# Patient Record
Sex: Female | Born: 1950 | Race: White | Hispanic: No | Marital: Married | Smoking: Current some day smoker
Health system: Southern US, Community
[De-identification: ages and names within clinical notes are randomized; demographics above are authoritative.]

## PROBLEM LIST (undated history)

## (undated) DIAGNOSIS — Z5189 Encounter for other specified aftercare: Secondary | ICD-10-CM

## (undated) DIAGNOSIS — N390 Urinary tract infection, site not specified: Secondary | ICD-10-CM

## (undated) DIAGNOSIS — F419 Anxiety disorder, unspecified: Secondary | ICD-10-CM

## (undated) DIAGNOSIS — T7840XA Allergy, unspecified, initial encounter: Secondary | ICD-10-CM

## (undated) DIAGNOSIS — Z905 Acquired absence of kidney: Secondary | ICD-10-CM

## (undated) DIAGNOSIS — IMO0002 Reserved for concepts with insufficient information to code with codable children: Secondary | ICD-10-CM

## (undated) DIAGNOSIS — M8580 Other specified disorders of bone density and structure, unspecified site: Secondary | ICD-10-CM

## (undated) DIAGNOSIS — I10 Essential (primary) hypertension: Secondary | ICD-10-CM

## (undated) HISTORY — DX: Anxiety disorder, unspecified: F41.9

## (undated) HISTORY — DX: Essential (primary) hypertension: I10

## (undated) HISTORY — PX: APPENDECTOMY: SHX54

## (undated) HISTORY — DX: Other specified disorders of bone density and structure, unspecified site: M85.80

## (undated) HISTORY — PX: NEPHRECTOMY: SHX65

## (undated) HISTORY — DX: Reserved for concepts with insufficient information to code with codable children: IMO0002

## (undated) HISTORY — DX: Urinary tract infection, site not specified: N39.0

## (undated) HISTORY — PX: TUBAL LIGATION: SHX77

## (undated) HISTORY — DX: Encounter for other specified aftercare: Z51.89

## (undated) HISTORY — DX: Allergy, unspecified, initial encounter: T78.40XA

## (undated) HISTORY — PX: POLYPECTOMY: SHX149

## (undated) HISTORY — DX: Acquired absence of kidney: Z90.5

---

## 1999-01-05 ENCOUNTER — Encounter: Admission: RE | Admit: 1999-01-05 | Discharge: 1999-01-05 | Payer: Self-pay | Admitting: Obstetrics and Gynecology

## 1999-01-05 ENCOUNTER — Encounter: Payer: Self-pay | Admitting: Obstetrics and Gynecology

## 2000-01-06 ENCOUNTER — Encounter: Admission: RE | Admit: 2000-01-06 | Discharge: 2000-01-06 | Payer: Self-pay | Admitting: Obstetrics and Gynecology

## 2000-01-06 ENCOUNTER — Encounter: Payer: Self-pay | Admitting: Obstetrics and Gynecology

## 2000-04-03 ENCOUNTER — Other Ambulatory Visit: Admission: RE | Admit: 2000-04-03 | Discharge: 2000-04-03 | Payer: Self-pay | Admitting: Obstetrics and Gynecology

## 2000-04-03 ENCOUNTER — Encounter (INDEPENDENT_AMBULATORY_CARE_PROVIDER_SITE_OTHER): Payer: Self-pay | Admitting: Specialist

## 2001-01-08 ENCOUNTER — Encounter: Payer: Self-pay | Admitting: Obstetrics and Gynecology

## 2001-01-08 ENCOUNTER — Encounter: Admission: RE | Admit: 2001-01-08 | Discharge: 2001-01-08 | Payer: Self-pay | Admitting: Obstetrics and Gynecology

## 2002-02-13 ENCOUNTER — Encounter: Admission: RE | Admit: 2002-02-13 | Discharge: 2002-02-13 | Payer: Self-pay | Admitting: Obstetrics and Gynecology

## 2002-02-13 ENCOUNTER — Encounter: Payer: Self-pay | Admitting: Obstetrics and Gynecology

## 2002-11-06 ENCOUNTER — Encounter: Admission: RE | Admit: 2002-11-06 | Discharge: 2002-11-06 | Payer: Self-pay

## 2002-11-25 ENCOUNTER — Ambulatory Visit (HOSPITAL_COMMUNITY): Admission: RE | Admit: 2002-11-25 | Discharge: 2002-11-25 | Payer: Self-pay

## 2003-04-22 ENCOUNTER — Encounter: Admission: RE | Admit: 2003-04-22 | Discharge: 2003-04-22 | Payer: Self-pay | Admitting: Obstetrics and Gynecology

## 2004-01-07 ENCOUNTER — Ambulatory Visit: Payer: Self-pay | Admitting: Internal Medicine

## 2004-01-21 ENCOUNTER — Ambulatory Visit: Payer: Self-pay | Admitting: Internal Medicine

## 2004-01-21 HISTORY — PX: COLONOSCOPY: SHX174

## 2004-08-11 ENCOUNTER — Encounter: Admission: RE | Admit: 2004-08-11 | Discharge: 2004-08-11 | Payer: Self-pay | Admitting: Obstetrics and Gynecology

## 2005-09-28 ENCOUNTER — Encounter: Admission: RE | Admit: 2005-09-28 | Discharge: 2005-09-28 | Payer: Self-pay | Admitting: Obstetrics and Gynecology

## 2007-01-09 ENCOUNTER — Encounter: Admission: RE | Admit: 2007-01-09 | Discharge: 2007-01-09 | Payer: Self-pay | Admitting: Obstetrics and Gynecology

## 2008-03-10 ENCOUNTER — Encounter: Admission: RE | Admit: 2008-03-10 | Discharge: 2008-03-10 | Payer: Self-pay | Admitting: Internal Medicine

## 2008-10-26 ENCOUNTER — Encounter (INDEPENDENT_AMBULATORY_CARE_PROVIDER_SITE_OTHER): Payer: Self-pay | Admitting: General Surgery

## 2008-10-26 ENCOUNTER — Inpatient Hospital Stay (HOSPITAL_COMMUNITY): Admission: EM | Admit: 2008-10-26 | Discharge: 2008-10-27 | Payer: Self-pay | Admitting: Emergency Medicine

## 2010-03-24 ENCOUNTER — Other Ambulatory Visit: Payer: Self-pay | Admitting: Internal Medicine

## 2010-03-24 DIAGNOSIS — Z1231 Encounter for screening mammogram for malignant neoplasm of breast: Secondary | ICD-10-CM

## 2010-04-07 ENCOUNTER — Ambulatory Visit
Admission: RE | Admit: 2010-04-07 | Discharge: 2010-04-07 | Disposition: A | Discharge status: Home / Self Care | Payer: 59 | Source: Ambulatory Visit | Attending: Internal Medicine | Admitting: Internal Medicine

## 2010-04-07 DIAGNOSIS — Z1231 Encounter for screening mammogram for malignant neoplasm of breast: Secondary | ICD-10-CM

## 2010-05-13 LAB — URINALYSIS, ROUTINE W REFLEX MICROSCOPIC
Bilirubin Urine: NEGATIVE
Glucose, UA: NEGATIVE mg/dL
Ketones, ur: 40 mg/dL — AB
Nitrite: NEGATIVE
Protein, ur: NEGATIVE mg/dL
Specific Gravity, Urine: 1.016 (ref 1.005–1.030)
Urobilinogen, UA: 0.2 mg/dL (ref 0.0–1.0)
pH: 7.5 (ref 5.0–8.0)

## 2010-05-13 LAB — CBC
HCT: 39.6 % (ref 36.0–46.0)
Hemoglobin: 13.5 g/dL (ref 12.0–15.0)
MCHC: 34.2 g/dL (ref 30.0–36.0)
MCV: 93.1 fL (ref 78.0–100.0)
Platelets: 220 10*3/uL (ref 150–400)
RBC: 4.26 MIL/uL (ref 3.87–5.11)
RDW: 12.9 % (ref 11.5–15.5)
WBC: 10.9 10*3/uL — ABNORMAL HIGH (ref 4.0–10.5)

## 2010-05-13 LAB — COMPREHENSIVE METABOLIC PANEL
ALT: 20 U/L (ref 0–35)
AST: 33 U/L (ref 0–37)
Albumin: 4.7 g/dL (ref 3.5–5.2)
Alkaline Phosphatase: 45 U/L (ref 39–117)
BUN: 15 mg/dL (ref 6–23)
CO2: 27 mEq/L (ref 19–32)
Calcium: 10.5 mg/dL (ref 8.4–10.5)
Chloride: 101 mEq/L (ref 96–112)
Creatinine, Ser: 1.1 mg/dL (ref 0.4–1.2)
GFR calc Af Amer: >60 mL/min (ref >60)
GFR calc non Af Amer: 51 mL/min — ABNORMAL LOW (ref >60)
Glucose, Bld: 101 mg/dL — ABNORMAL HIGH (ref 70–99)
Potassium: 3.6 mEq/L (ref 3.5–5.1)
Sodium: 139 mEq/L (ref 135–145)
Total Bilirubin: 2 mg/dL — ABNORMAL HIGH (ref 0.3–1.2)
Total Protein: 7.4 g/dL (ref 6.0–8.3)

## 2010-05-13 LAB — LIPASE, BLOOD: Lipase: 34 U/L (ref 11–59)

## 2010-05-13 LAB — DIFFERENTIAL
Basophils Absolute: 0 10*3/uL (ref 0.0–0.1)
Basophils Relative: 0 % (ref 0–1)
Eosinophils Absolute: 0.1 10*3/uL (ref 0.0–0.7)
Eosinophils Relative: 1 % (ref 0–5)
Lymphocytes Relative: 9 % — ABNORMAL LOW (ref 12–46)
Lymphs Abs: 1 10*3/uL (ref 0.7–4.0)
Monocytes Absolute: 0.6 10*3/uL (ref 0.1–1.0)
Monocytes Relative: 6 % (ref 3–12)
Neutro Abs: 9.2 10*3/uL — ABNORMAL HIGH (ref 1.7–7.7)
Neutrophils Relative %: 85 % — ABNORMAL HIGH (ref 43–77)

## 2010-05-13 LAB — URINE MICROSCOPIC-ADD ON

## 2011-04-27 ENCOUNTER — Other Ambulatory Visit: Payer: Self-pay | Admitting: Internal Medicine

## 2011-04-27 DIAGNOSIS — Z1231 Encounter for screening mammogram for malignant neoplasm of breast: Secondary | ICD-10-CM

## 2011-05-04 ENCOUNTER — Ambulatory Visit
Admission: RE | Admit: 2011-05-04 | Discharge: 2011-05-04 | Disposition: A | Discharge status: Home / Self Care | Payer: BC Managed Care – PPO | Source: Ambulatory Visit | Attending: Internal Medicine | Admitting: Internal Medicine

## 2011-05-04 DIAGNOSIS — Z1231 Encounter for screening mammogram for malignant neoplasm of breast: Secondary | ICD-10-CM

## 2012-06-20 ENCOUNTER — Other Ambulatory Visit: Payer: Self-pay

## 2012-06-20 DIAGNOSIS — Z1231 Encounter for screening mammogram for malignant neoplasm of breast: Secondary | ICD-10-CM

## 2012-08-21 ENCOUNTER — Ambulatory Visit
Admission: RE | Admit: 2012-08-21 | Discharge: 2012-08-21 | Disposition: A | Discharge status: Home / Self Care | Payer: BC Managed Care – PPO | Source: Ambulatory Visit

## 2012-08-21 DIAGNOSIS — Z1231 Encounter for screening mammogram for malignant neoplasm of breast: Secondary | ICD-10-CM

## 2013-03-18 ENCOUNTER — Encounter: Payer: Self-pay | Admitting: Obstetrics and Gynecology

## 2013-03-21 ENCOUNTER — Ambulatory Visit: Payer: Self-pay | Admitting: Obstetrics and Gynecology

## 2013-04-14 ENCOUNTER — Ambulatory Visit (INDEPENDENT_AMBULATORY_CARE_PROVIDER_SITE_OTHER): Payer: No Typology Code available for payment source | Admitting: Obstetrics and Gynecology

## 2013-04-14 ENCOUNTER — Encounter: Payer: Self-pay | Admitting: Obstetrics and Gynecology

## 2013-04-14 VITALS — BP 118/80 | HR 56 | Resp 16 | Ht 63.25 in | Wt 106.8 lb

## 2013-04-14 DIAGNOSIS — Z0000 Encounter for general adult medical examination without abnormal findings: Secondary | ICD-10-CM

## 2013-04-14 DIAGNOSIS — Z01419 Encounter for gynecological examination (general) (routine) without abnormal findings: Secondary | ICD-10-CM

## 2013-04-14 LAB — POCT URINALYSIS DIPSTICK
Bilirubin, UA: NEGATIVE
Glucose, UA: NEGATIVE
Ketones, UA: NEGATIVE
Leukocytes, UA: NEGATIVE
Nitrite, UA: NEGATIVE
Protein, UA: NEGATIVE
Urobilinogen, UA: NEGATIVE
pH, UA: 5

## 2013-04-14 MED ORDER — ESTRADIOL 2 MG VA RING: 2.0000 mg | VAGINAL_RING | VAGINAL | Status: DC

## 2013-04-14 NOTE — Progress Notes (Signed)
Patient ID: Natasha Stevens, female   DOB: Mar 18, 1950, 63 y.o.   MRN: [id number] GYNECOLOGY VISIT  PCP:   Lang Snow, MD  Referring provider:   HPI: 63 y.o.   Married  Caucasian  female   G3P3 with Patient's last menstrual period was 02/06/2002.   here for   AEX. Has been using the Estring.  Forgets to change it.  Wants to refill.  Has a new job.  Hgb:    PCP Urine:  1+ RBC's (per pt. This is common for her--neg. W/u with urologist)  GYNECOLOGIC HISTORY: Patient's last menstrual period was 02/06/2002. Sexually active:  yes Partner preference: female Contraception:  Tubal  Menopausal hormone therapy: n/a DES exposure:   no Blood transfusions:   Yes at age 35 when had nephrectomy Sexually transmitted diseases:no    GYN procedures and prior surgeries: Tubal ligation  Last mammogram:  July 2014 wnl, extremely dense:[facility]               Last pap and high risk HPV testing:  03-20-12 wnl:neg HR HPV  History of abnormal pap smear:  no   OB History   Grav Para Term Preterm Abortions TAB SAB Ect Mult Living   3 3        3        LIFESTYLE: Exercise:   aerobics            Tobacco:   no Alcohol:      14-18 glasses of wine per week Drug use:   o  OTHER HEALTH MAINTENANCE: Tetanus/TDap:   Up to date with PCP Gardisil:              n/a Influenza:            2014 Zostavax:            no  Bone density:    2013:osteopenia. Done with PCP Colonoscopy:    10 years ago with Dr. Sydell Axon Brodie:wnl  Cholesterol check: 2014 wnl with PCP  Family History  Problem Relation Age of Onset  . Hypertension Mother   . Osteoporosis Mother   . Anxiety disorder Sister   . Diabetes Maternal Grandfather   . Stroke Maternal Grandfather   . Hyperlipidemia Father     There are no active problems to display for this patient.  Past Medical History  Diagnosis Date  . 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 Osteopenia   . Frequent UTI     Hematuria  . Anxiety   . Blood transfusion  without reported diagnosis     w/nephrectomy at age 89  . Dyspareunia     Past Surgical History  Procedure Laterality Date  . Tubal ligation    . Nephrectomy      --age 33  . Appendectomy      ALLERGIES: Macrodantin  Current Outpatient Prescriptions  Medication Sig Dispense Refill  . Calcium Carb-Cholecalciferol (CALCIUM 600 + D PO) Take by mouth daily.      . Cholecalciferol (VITAMIN D PO) Take by mouth daily.      Marland Kitchen escitalopram (LEXAPRO) 20 MG tablet Take 20 mg by mouth daily.      . fluticasone (FLONASE) 50 MCG/ACT nasal spray Place 1 spray into both nostrils as needed.      . Zolpidem Tartrate (AMBIEN PO) Take 5 mg by mouth as needed.        No current facility-administered medications for this visit.  ROS:  Pertinent items are noted in HPI.  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   PHYSICAL EXAMINATION:    BP 118/80  Pulse 56  Resp 16  Ht 5' 3.25" (1.607 m)  Wt 106 lb 12.8 oz (48.444 kg)  BMI 18.76 kg/m2  LMP 02/06/2002   Wt Readings from Last 3 Encounters:  04/14/13 106 lb 12.8 oz (48.444 kg)     Ht Readings from Last 3 Encounters:  04/14/13 5' 3.25" (1.607 m)    General appearance: alert, cooperative and appears stated age Head: Normocephalic, without obvious abnormality, atraumatic Neck: no adenopathy, supple, symmetrical, trachea midline and thyroid not enlarged, symmetric, no tenderness/mass/nodules Lungs: clear to auscultation bilaterally Breasts: Inspection negative, No nipple retraction or dimpling, No nipple discharge or bleeding, No axillary or supraclavicular adenopathy, Normal to palpation without dominant masses Heart: regular rate and rhythm Abdomen: soft, non-tender; no masses,  no organomegaly Extremities: extremities normal, atraumatic, no cyanosis or edema Skin: Skin color, texture, turgor normal. No rashes or lesions Lymph nodes: Cervical, supraclavicular, and axillary nodes normal. No abnormal inguinal nodes  palpated Neurologic: Grossly normal  Pelvic: External genitalia:  no lesions              Urethra:  normal appearing urethra with no masses, tenderness or lesions              Bartholins and Skenes: normal                 Vagina: normal appearing vagina with normal color and discharge, no lesions.  Estring in place.               Cervix: normal appearance              Pap and high risk HPV testing done: no.            Bimanual Exam:  Uterus:  uterus is normal size, shape, consistency and nontender                                      Adnexa: normal adnexa in size, nontender and no masses                                      Rectovaginal: Confirms                                      Anus:  normal sphincter tone, no lesions  ASSESSMENT  Normal gynecologic exam. Osteopenia. Estring patient.   PLAN  Mammogram recommended yearly. I recommend 3D. Pap smear and high risk HPV testing not indicated.  Counseled on self breast exam, Calcium and vitamin D intake, exercise. Estring refilled for one year.  Reviewed benefits and risks - MI, DVT, PE, stroke, breast cancer. Zostivax recommended.  Bone density through PCP. Colonoscopy due.  Patient will call her GI. Return annually or prn   An After Visit Summary was printed and given to the patient.

## 2013-04-14 NOTE — Patient Instructions (Signed)

## 2013-08-11 ENCOUNTER — Other Ambulatory Visit: Payer: Self-pay

## 2013-08-11 DIAGNOSIS — Z1231 Encounter for screening mammogram for malignant neoplasm of breast: Secondary | ICD-10-CM

## 2013-09-09 ENCOUNTER — Encounter (INDEPENDENT_AMBULATORY_CARE_PROVIDER_SITE_OTHER): Payer: Self-pay

## 2013-09-09 ENCOUNTER — Ambulatory Visit
Admission: RE | Admit: 2013-09-09 | Discharge: 2013-09-09 | Disposition: A | Discharge status: Home / Self Care | Payer: No Typology Code available for payment source | Source: Ambulatory Visit

## 2013-09-09 DIAGNOSIS — Z1231 Encounter for screening mammogram for malignant neoplasm of breast: Secondary | ICD-10-CM

## 2013-10-15 ENCOUNTER — Encounter: Payer: Self-pay | Admitting: Internal Medicine

## 2013-11-19 ENCOUNTER — Telehealth: Payer: Self-pay | Admitting: Obstetrics and Gynecology

## 2013-11-19 NOTE — Telephone Encounter (Signed)
LMTCB about canceled appointment with Dr. Quincy Simmonds

## 2013-11-20 NOTE — Telephone Encounter (Signed)
Pt called back and schedule.

## 2013-12-04 ENCOUNTER — Encounter: Payer: Self-pay | Admitting: Internal Medicine

## 2013-12-08 ENCOUNTER — Encounter: Payer: Self-pay | Admitting: Obstetrics and Gynecology

## 2014-02-11 ENCOUNTER — Encounter: Payer: No Typology Code available for payment source | Admitting: Internal Medicine

## 2014-04-16 ENCOUNTER — Ambulatory Visit (INDEPENDENT_AMBULATORY_CARE_PROVIDER_SITE_OTHER): Payer: BLUE CROSS/BLUE SHIELD | Admitting: Obstetrics and Gynecology

## 2014-04-16 ENCOUNTER — Encounter: Payer: Self-pay | Admitting: Obstetrics and Gynecology

## 2014-04-16 VITALS — BP 120/78 | HR 56 | Resp 14 | Ht 63.0 in | Wt 105.8 lb

## 2014-04-16 DIAGNOSIS — Z0000 Encounter for general adult medical examination without abnormal findings: Secondary | ICD-10-CM

## 2014-04-16 DIAGNOSIS — Z01419 Encounter for gynecological examination (general) (routine) without abnormal findings: Secondary | ICD-10-CM

## 2014-04-16 LAB — POCT URINALYSIS DIPSTICK
Bilirubin, UA: NEGATIVE
Glucose, UA: NEGATIVE
Ketones, UA: NEGATIVE
Leukocytes, UA: NEGATIVE
Nitrite, UA: NEGATIVE
Protein, UA: NEGATIVE
Urobilinogen, UA: NEGATIVE
pH, UA: 5

## 2014-04-16 NOTE — Progress Notes (Signed)
Patient ID: Natasha Stevens, female   DOB: 1950/06/28, 64 y.o.   MRN: [id number] 63 y.o. G3P3 MarriedCaucasianF here for annual exam.    Mole on her bottom.   Off Estring.  Was forgetting to change it. Feels she does not need this.   Osteopenia on bone density in 2015.   Stable.  Was on Fosamax and doing a drug holiday.  Now off for 2 years.  PCP:  Lang Snow, MD  Patient's last menstrual period was 02/06/2002.          Sexually active: Yes.  female partner  The current method of family planning is tubal ligation.    Exercising: Yes.    aerobics, weights and jogging. Smoker:  no  Health Maintenance: Pap:  03-20-12 wnl:neg HR HPV History of abnormal Pap:  no MMG:  09-09-13 extremely dense/nl:[facility] Colonoscopy:  Due 01/2015 with Dr. Delfin Edis. BMD:   2013 Osteopenia with PCP TDaP:  Up to date with PCP Screening Labs:  Hb today: PCP, Urine today: 1+RBC's--has had neg. Urological work-up.  Sees Urology periodically, every 5 - 6 years.    reports that she has never smoked. She does not have any smokeless tobacco history on file. She reports that she drinks about 8.4 oz of alcohol per week. She reports that she does not use illicit drugs.  Past Medical History  Diagnosis Date  . 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 Osteopenia   . Frequent UTI     Hematuria  . Anxiety   . Blood transfusion without reported diagnosis     w/nephrectomy at age 61  . Dyspareunia     Past Surgical History  Procedure Laterality Date  . Tubal ligation    . Nephrectomy      --age 89  . Appendectomy      Current Outpatient Prescriptions  Medication Sig Dispense Refill  . Calcium Carb-Cholecalciferol (CALCIUM 600 + D PO) Take by mouth daily.    . Cholecalciferol (VITAMIN D PO) Take by mouth daily.    Marland Kitchen escitalopram (LEXAPRO) 20 MG tablet Take 20 mg by mouth daily.    . fluticasone (FLONASE) 50 MCG/ACT nasal spray Place 1 spray into both nostrils as needed.    . Zolpidem Tartrate  (AMBIEN PO) Take 5 mg by mouth as needed.      No current facility-administered medications for this visit.    Family History  Problem Relation Age of Onset  . Hypertension Mother   . Osteoporosis Mother   . Anxiety disorder Sister   . Diabetes Maternal Grandfather   . Stroke Maternal Grandfather   . Hyperlipidemia Father     ROS:  Pertinent items are noted in HPI.  Otherwise, a comprehensive ROS was negative.  Exam:   BP 120/78 mmHg  Pulse 56  Resp 14  Ht 5\' 3"  (1.6 m)  Wt 105 lb 12.8 oz (47.991 kg)  BMI 18.75 kg/m2  LMP 02/06/2002     Height: 5\' 3"  (160 cm)  Ht Readings from Last 3 Encounters:  04/16/14 5\' 3"  (1.6 m)  04/14/13 5' 3.25" (1.607 m)    General appearance: alert, cooperative and appears stated age Head: Normocephalic, without obvious abnormality, atraumatic Neck: no adenopathy, supple, symmetrical, trachea midline and thyroid normal to inspection and palpation Lungs: clear to auscultation bilaterally Breasts: normal appearance, no masses or tenderness, Inspection negative, No nipple retraction or dimpling, No nipple discharge or bleeding, No axillary or supraclavicular adenopathy  Heart: regular rate and rhythm Abdomen: soft, non-tender; bowel sounds normal; no masses,  no organomegaly Extremities: extremities normal, atraumatic, no cyanosis or edema Skin: Skin color, texture, turgor normal. No rashes or lesions Lymph nodes: Cervical, supraclavicular, and axillary nodes normal. No abnormal inguinal nodes palpated Neurologic: Grossly normal   Pelvic: External genitalia:  no lesions.  Left labia majora with 2 mm sebaceous cyst with dried sebum in it.               Urethra:  normal appearing urethra with no masses, tenderness or lesions              Bartholins and Skenes: normal                 Vagina: normal appearing vagina with normal color and discharge, no lesions.  Estring in vagina and was removed and discarded.  Patient states it has probably been in  for 6 months.               Cervix: no lesions              Pap taken: No. Bimanual Exam:  Uterus:  normal size, contour, position, consistency, mobility, non-tender              Adnexa: normal adnexa and no mass, fullness, tenderness               Rectovaginal: Confirms               Anus:  normal sphincter tone, no lesions  Chaperone was present for exam.  A:  Well Woman with normal exam Prolonged Estring use.  Dense breast tissue on mammogram.  Osteopenia.  Microscopic hematuria.  Work up negative.   P:   Mammogram yearly.  Discussed 3D. pap smear and HR HPV testing next year.  Call if desire to return to Estring use or local vaginal estrogen treatment.  Ca/Vit/weight bearing exercise.  Bone density per PCP.  return annually or prn

## 2014-04-16 NOTE — Patient Instructions (Addendum)

## 2014-04-23 ENCOUNTER — Ambulatory Visit: Payer: Self-pay | Admitting: Obstetrics and Gynecology

## 2014-06-15 ENCOUNTER — Encounter: Payer: Self-pay | Admitting: Internal Medicine

## 2014-08-03 ENCOUNTER — Other Ambulatory Visit: Payer: Self-pay

## 2014-08-03 DIAGNOSIS — Z1231 Encounter for screening mammogram for malignant neoplasm of breast: Secondary | ICD-10-CM

## 2014-09-21 ENCOUNTER — Telehealth: Payer: Self-pay | Admitting: Internal Medicine

## 2014-09-21 NOTE — Telephone Encounter (Signed)
Happily. 

## 2014-09-21 NOTE — Telephone Encounter (Signed)
Dr Henrene Pastor- Please advise... Will you accept patient?

## 2014-10-13 ENCOUNTER — Ambulatory Visit
Admission: RE | Admit: 2014-10-13 | Discharge: 2014-10-13 | Disposition: A | Discharge status: Home / Self Care | Payer: BLUE CROSS/BLUE SHIELD | Source: Ambulatory Visit

## 2014-10-13 DIAGNOSIS — Z1231 Encounter for screening mammogram for malignant neoplasm of breast: Secondary | ICD-10-CM

## 2014-11-30 ENCOUNTER — Ambulatory Visit (AMBULATORY_SURGERY_CENTER): Payer: Self-pay | Admitting: *Deleted

## 2014-11-30 VITALS — Ht 63.5 in | Wt 108.6 lb

## 2014-11-30 DIAGNOSIS — Z1211 Encounter for screening for malignant neoplasm of colon: Secondary | ICD-10-CM

## 2014-11-30 MED ORDER — NA SULFATE-K SULFATE-MG SULF 17.5-3.13-1.6 GM/177ML PO SOLN: 1.0000 | Freq: Once | ORAL | Status: DC

## 2014-11-30 NOTE — Progress Notes (Signed)
No egg or soy allergy No issues with past sedation No diet pills No home 02 use  emmi declined  

## 2014-12-15 ENCOUNTER — Ambulatory Visit (AMBULATORY_SURGERY_CENTER): Payer: BLUE CROSS/BLUE SHIELD | Admitting: Internal Medicine

## 2014-12-15 ENCOUNTER — Encounter: Payer: Self-pay | Admitting: Internal Medicine

## 2014-12-15 VITALS — BP 128/97 | HR 53 | Temp 97.6°F | Resp 24 | Ht 63.5 in | Wt 108.0 lb

## 2014-12-15 DIAGNOSIS — D120 Benign neoplasm of cecum: Secondary | ICD-10-CM | POA: Diagnosis not present

## 2014-12-15 DIAGNOSIS — D123 Benign neoplasm of transverse colon: Secondary | ICD-10-CM | POA: Diagnosis not present

## 2014-12-15 DIAGNOSIS — D122 Benign neoplasm of ascending colon: Secondary | ICD-10-CM

## 2014-12-15 DIAGNOSIS — Z1211 Encounter for screening for malignant neoplasm of colon: Secondary | ICD-10-CM

## 2014-12-15 MED ORDER — SODIUM CHLORIDE 0.9 % IV SOLN: 500.0000 mL | INTRAVENOUS | Status: DC

## 2014-12-15 NOTE — Progress Notes (Signed)
Called to room to assist during endoscopic procedure.  Patient ID and intended procedure confirmed with present staff. Received instructions for my participation in the procedure from the performing physician.  

## 2014-12-15 NOTE — Op Note (Signed)
[place]  [address]. [place], [id number]   COLONOSCOPY PROCEDURE REPORT  PATIENT: Natasha, Stevens  MR#: [id number] BIRTHDATE: Mar 12, 1950 , 25  yrs. old GENDER: female ENDOSCOPIST: Eustace Quail, MD REFERRED CW:[id number] Recall, PROCEDURE DATE:  12/15/2014 PROCEDURE:   Colonoscopy, screening and Colonoscopy with snare polypectomy x 4 First Screening Colonoscopy - Avg.  risk and is 50 yrs.  old or older - No.  Prior Negative Screening - Now for repeat screening. 10 or more years since last screening  History of Adenoma - Now for follow-up colonoscopy & has been > or = to 3 yrs.  N/A  Polyps removed today? Yes ASA CLASS:   Class I INDICATIONS:Screening for colonic neoplasia and Colorectal Neoplasm Risk Assessment for this procedure is average risk. Index examination December 2005 normal (DB). MEDICATIONS: Monitored anesthesia care and Propofol 300 mg IV  DESCRIPTION OF PROCEDURE:   After the risks benefits and alternatives of the procedure were thoroughly explained, informed consent was obtained.  The digital rectal exam revealed no abnormalities of the rectum.   The [id number]  endoscope was introduced through the anus and advanced to the cecum, which was identified by both the appendix and ileocecal valve. No adverse events experienced.   The quality of the prep was excellent. (Suprep was used)  The instrument was then slowly withdrawn as the colon was fully examined. Estimated blood loss is zero unless otherwise noted in this procedure report.    COLON FINDINGS: Four polyps were found at the hepatic flexure (79mm sessile), in the ascending colon (82mm), and at the cecum (65mm, 81mm).  A polypectomy was performed with a cold snare.  The resection was complete, the polyp tissue was completely retrieved and sent to histology.   The examination was otherwise normal. Retroflexed views revealed internal hemorrhoids. The time to cecum = 3.0 Withdrawal  time = 22.4   The scope was withdrawn and the procedure completed. COMPLICATIONS: There were no immediate complications.  ENDOSCOPIC IMPRESSION: 1.    Four polyps were found  in the colon; polypectomy was performed with a cold snare 2.   The examination was otherwise normal  RECOMMENDATIONS: 1. Repeat Colonoscopy in 3 years if 3 or more adenomas/SSP, ootherwise 5 years.  eSigned:  Eustace Quail, MD 12/15/2014 11:11 AM   cc: The Patient and W.  Lutricia Feil, MD

## 2014-12-15 NOTE — Progress Notes (Signed)
Report to PACU, RN, vss, BBS= Clear.  

## 2014-12-15 NOTE — Patient Instructions (Signed)
YOU HAD AN ENDOSCOPIC PROCEDURE TODAY AT [facility]:   Refer to the procedure report that was given to you for any specific questions about what was found during the examination.  If the procedure report does not answer your questions, please call your gastroenterologist to clarify.  If you requested that your care partner not be given the details of your procedure findings, then the procedure report has been included in a sealed envelope for you to review at your convenience later.  YOU SHOULD EXPECT: Some feelings of bloating in the abdomen. Passage of more gas than usual.  Walking can help get rid of the air that was put into your GI tract during the procedure and reduce the bloating. If you had a lower endoscopy (such as a colonoscopy or flexible sigmoidoscopy) you may notice spotting of blood in your stool or on the toilet paper. If you underwent a bowel prep for your procedure, you may not have a normal bowel movement for a few days.  Please Note:  You might notice some irritation and congestion in your nose or some drainage.  This is from the oxygen used during your procedure.  There is no need for concern and it should clear up in a day or so.  SYMPTOMS TO REPORT IMMEDIATELY:   Following lower endoscopy (colonoscopy or flexible sigmoidoscopy):  Excessive amounts of blood in the stool  Significant tenderness or worsening of abdominal pains  Swelling of the abdomen that is new, acute  Fever of 100F or higher    For urgent or emergent issues, a gastroenterologist can be reached at any hour by calling [phone].   DIET: Your first meal following the procedure should be a small meal and then it is ok to progress to your normal diet. Heavy or fried foods are harder to digest and may make you feel nauseous or bloated.  Likewise, meals heavy in dairy and vegetables can increase bloating.  Drink plenty of fluids but you should avoid alcoholic beverages for 24  hours.  ACTIVITY:  You should plan to take it easy for the rest of today and you should NOT DRIVE or use heavy machinery until tomorrow (because of the sedation medicines used during the test).    FOLLOW UP: Our staff will call the number listed on your records the next business day following your procedure to check on you and address any questions or concerns that you may have regarding the information given to you following your procedure. If we do not reach you, we will leave a message.  However, if you are feeling well and you are not experiencing any problems, there is no need to return our call.  We will assume that you have returned to your regular daily activities without incident.  If any biopsies were taken you will be contacted by phone or by letter within the next 1-3 weeks.  Please call us at [phone] if you have not heard about the biopsies in 3 weeks.    SIGNATURES/CONFIDENTIALITY: You and/or your care partner have signed paperwork which will be entered into your electronic medical record.  These signatures attest to the fact that that the information above on your After Visit Summary has been reviewed and is understood.  Full responsibility of the confidentiality of this discharge information lies with you and/or your care-partner.  Polyp information given.  Dr. Henrene Pastor will advise you about next colonoscopy after pathology report is reviewed.

## 2014-12-16 ENCOUNTER — Telehealth: Payer: Self-pay | Admitting: *Deleted

## 2014-12-16 NOTE — Telephone Encounter (Signed)
  Follow up Call-  Call back number 12/15/2014  Post procedure Call Back phone  # [phone]  Permission to leave phone message Yes     No answer, left message.

## 2014-12-21 ENCOUNTER — Encounter: Payer: Self-pay | Admitting: Internal Medicine

## 2015-04-30 ENCOUNTER — Encounter: Payer: Self-pay | Admitting: Obstetrics and Gynecology

## 2015-04-30 ENCOUNTER — Ambulatory Visit (INDEPENDENT_AMBULATORY_CARE_PROVIDER_SITE_OTHER): Payer: BLUE CROSS/BLUE SHIELD | Admitting: Obstetrics and Gynecology

## 2015-04-30 VITALS — BP 114/72 | HR 52 | Ht 63.0 in | Wt 107.0 lb

## 2015-04-30 DIAGNOSIS — Z01419 Encounter for gynecological examination (general) (routine) without abnormal findings: Secondary | ICD-10-CM | POA: Diagnosis not present

## 2015-04-30 DIAGNOSIS — Z0000 Encounter for general adult medical examination without abnormal findings: Secondary | ICD-10-CM

## 2015-04-30 NOTE — Patient Instructions (Signed)

## 2015-04-30 NOTE — Progress Notes (Signed)
Patient ID: Natasha Stevens, female   DOB: 04-24-1950, 65 y.o.   MRN: [id number]  64 y.o. G31P0003 Married Caucasian female here for annual exam.    New dx of sciatica.   Has blood in the urine and has seen urology and a normal work up.  Status post nephrectomy after falling off a horse.   Expecting a first grandchild.  Mother is at [employer].   PCP:   Lang Snow, MD  Patient's last menstrual period was 02/06/2002.          Sexually active: Yes.    The current method of family planning is tubal ligation and post menopausal status. BTL   Exercising: Yes.    aerobics and stength training at least 3-4 times per week (treadmill, elliptical, weights). Smoker:  no  Health Maintenance: Pap:03/20/12, Negative with neg HR HPV History of abnormal Pap: no MMG:10/13/14 extremely dense/nl:[facility] Colonoscopy:12/15/14, Tubular Adenoma, repeat in 3 years BMD:2013 Osteopenia with PCP.  Thinks she just had it done.  Was on Fosamax and off for about 2 years. TDaP:Up to date with PCP Screening Labs:  Hb today: PCP 02/2015, Urine today: PCP 02/2015   reports that she has quit smoking. She has never used smokeless tobacco. She reports that she drinks about 8.4 oz of alcohol per week. She reports that she does not use illicit drugs.  Past Medical History  Diagnosis Date  . 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 Osteopenia   . Frequent UTI     Hematuria  . Anxiety   . Blood transfusion without reported diagnosis     w/nephrectomy at age 11  . Dyspareunia   . Allergy     Past Surgical History  Procedure Laterality Date  . Tubal ligation    . Nephrectomy      --age 42  . Appendectomy    . Colonoscopy  01-21-2004    with brodie Normal    Current Outpatient Prescriptions  Medication Sig Dispense Refill  . Calcium Carb-Cholecalciferol (CALCIUM 600 + D PO) Take by mouth daily.    . Cholecalciferol (VITAMIN D PO) Take by mouth daily.    Marland Kitchen escitalopram (LEXAPRO) 20 MG tablet  Take 20 mg by mouth daily.    . fluticasone (FLONASE) 50 MCG/ACT nasal spray Place 1 spray into both nostrils as needed.    . Zolpidem Tartrate (AMBIEN PO) Take 5 mg by mouth as needed.      No current facility-administered medications for this visit.    Family History  Problem Relation Age of Onset  . Hypertension Mother   . Osteoporosis Mother   . Anxiety disorder Sister   . Diabetes Maternal Grandfather   . Stroke Maternal Grandfather   . Hyperlipidemia Father   . Colon cancer Neg Hx   . Colon polyps Neg Hx   . Rectal cancer Neg Hx   . Stomach cancer Neg Hx     ROS:  Pertinent items are noted in HPI.  Otherwise, a comprehensive ROS was negative.  Exam:   LMP 02/06/2002    General appearance: alert, cooperative and appears stated age Head: Normocephalic, without obvious abnormality, atraumatic Neck: no adenopathy, supple, symmetrical, trachea midline and thyroid normal to inspection and palpation Lungs: clear to auscultation bilaterally Breasts: normal appearance, no masses or tenderness, Inspection negative, No nipple retraction or dimpling, No nipple discharge or bleeding, No axillary or supraclavicular adenopathy Heart: regular rate and rhythm Abdomen: scar of abdomen, soft,  non-tender; bowel sounds normal; no masses,  no organomegaly Extremities: extremities normal, atraumatic, no cyanosis or edema Skin: Skin color, texture, turgor normal. No rashes or lesions Lymph nodes: Cervical, supraclavicular, and axillary nodes normal. No abnormal inguinal nodes palpated Neurologic: Grossly normal  Pelvic: External genitalia:  no lesions              Urethra:  normal appearing urethra with no masses, tenderness or lesions              Bartholins and Skenes: normal                 Vagina: normal appearing vagina with normal color and discharge, no lesions              Cervix: no lesions              Pap taken: Yes.   Bimanual Exam:  Uterus:  normal size, contour, position,  consistency, mobility, non-tender              Adnexa: normal adnexa and no mass, fullness, tenderness              Rectovaginal: Yes.  .  Confirms.              Anus:  normal sphincter tone, no lesions  Chaperone was present for exam.  Assessment:   Well woman visit with normal exam. Osteopenia.   Plan: Yearly mammogram recommended after age 30. Continue 3D. Recommended self breast exam.  Pap and HR HPV as above. Recommendations for Calcium, Vitamin D, regular exercise program including cardiovascular and weight bearing exercise. BMD per Dr. Brigitte Pulse. Labs performed.  No..   Refills given on medications.  No..    Follow up annually and prn.     After visit summary provided.

## 2015-05-03 ENCOUNTER — Ambulatory Visit (INDEPENDENT_AMBULATORY_CARE_PROVIDER_SITE_OTHER): Payer: BLUE CROSS/BLUE SHIELD

## 2015-05-03 ENCOUNTER — Encounter: Payer: Self-pay | Admitting: Podiatry

## 2015-05-03 ENCOUNTER — Ambulatory Visit (INDEPENDENT_AMBULATORY_CARE_PROVIDER_SITE_OTHER): Payer: BLUE CROSS/BLUE SHIELD | Admitting: Podiatry

## 2015-05-03 VITALS — BP 136/91 | HR 54 | Resp 16

## 2015-05-03 DIAGNOSIS — M779 Enthesopathy, unspecified: Secondary | ICD-10-CM

## 2015-05-03 DIAGNOSIS — M205X2 Other deformities of toe(s) (acquired), left foot: Secondary | ICD-10-CM

## 2015-05-03 LAB — IPS PAP TEST WITH HPV

## 2015-05-03 MED ORDER — TRIAMCINOLONE ACETONIDE 10 MG/ML IJ SUSP
10.0000 mg | Freq: Once | INTRAMUSCULAR | Status: AC
  Administered 2015-05-03: 10 mg

## 2015-05-03 NOTE — Progress Notes (Signed)
Subjective:     Patient ID: Natasha Stevens, female   DOB: Apr 15, 1950, 64 y.o.   MRN: [id number]  HPI patient presents with pain in the left first MPJ with sharp shooting pains that radiate into the ankle and it's been going on for around 8 months and she remembers no specific injury   Review of Systems  All other systems reviewed and are negative.      Objective:   Physical Exam  Constitutional: She is oriented to person, place, and time.  Cardiovascular: Intact distal pulses.   Musculoskeletal: Normal range of motion.  Neurological: She is oriented to person, place, and time.  Skin: Skin is warm.  Nursing note and vitals reviewed.  neurovascular status intact muscle strength adequate range of motion within normal limits with patient found to have discomfort in the lateral side of the first MPJ left and slightly across the dorsal surface and also a prominence at the metatarsocuneiform joint that there is no indications of shooting pains emanating from this particular area. She is found to have good digital perfusion and is well oriented 3     Assessment:     Inflammatory changes consistent with hallux limitus with capsulitis left with possibility of irritation of the metatarsocuneiform joint    Plan:     H&P x-rays reviewed and both conditions discussed. Today I went ahead and I injected the lateral side of the first MPJ left 3 mg Kenalog 5 mill grams Xylocaine and advised on reduced activity. Patient was seen back if symptoms continue and may require orthotic treatment or other modalities  X-ray report indicatedindications of stress fracture with minimal arthritis and mild reduction of the joint

## 2015-05-03 NOTE — Progress Notes (Signed)
   Subjective:    Patient ID: Natasha Stevens, female    DOB: [birth year], 65 y.o.   MRN: [id number]  HPI  Pt presents with pain in the lef MPJ, sharp shooting in nature that radiates upward toward anterior ankle, x 8 months  Review of Systems  All other systems reviewed and are negative.      Objective:   Physical Exam        Assessment & Plan:

## 2015-06-02 DIAGNOSIS — D225 Melanocytic nevi of trunk: Secondary | ICD-10-CM | POA: Diagnosis not present

## 2015-06-02 DIAGNOSIS — Z85828 Personal history of other malignant neoplasm of skin: Secondary | ICD-10-CM | POA: Diagnosis not present

## 2015-06-02 DIAGNOSIS — D2261 Melanocytic nevi of right upper limb, including shoulder: Secondary | ICD-10-CM | POA: Diagnosis not present

## 2015-06-02 DIAGNOSIS — D2262 Melanocytic nevi of left upper limb, including shoulder: Secondary | ICD-10-CM | POA: Diagnosis not present

## 2015-09-06 ENCOUNTER — Other Ambulatory Visit: Payer: Self-pay | Admitting: Internal Medicine

## 2015-09-06 DIAGNOSIS — Z1231 Encounter for screening mammogram for malignant neoplasm of breast: Secondary | ICD-10-CM

## 2015-10-25 DIAGNOSIS — H0011 Chalazion right upper eyelid: Secondary | ICD-10-CM | POA: Diagnosis not present

## 2015-11-17 ENCOUNTER — Ambulatory Visit: Payer: BLUE CROSS/BLUE SHIELD

## 2015-12-13 ENCOUNTER — Ambulatory Visit: Payer: BLUE CROSS/BLUE SHIELD

## 2015-12-17 ENCOUNTER — Ambulatory Visit
Admission: RE | Admit: 2015-12-17 | Discharge: 2015-12-17 | Disposition: A | Discharge status: Home / Self Care | Payer: BLUE CROSS/BLUE SHIELD | Source: Ambulatory Visit | Attending: Internal Medicine | Admitting: Internal Medicine

## 2015-12-17 DIAGNOSIS — Z1231 Encounter for screening mammogram for malignant neoplasm of breast: Secondary | ICD-10-CM | POA: Diagnosis not present

## 2016-01-06 DIAGNOSIS — M859 Disorder of bone density and structure, unspecified: Secondary | ICD-10-CM | POA: Diagnosis not present

## 2016-01-06 DIAGNOSIS — R8299 Other abnormal findings in urine: Secondary | ICD-10-CM | POA: Diagnosis not present

## 2016-01-06 DIAGNOSIS — Z0000 Encounter for general adult medical examination without abnormal findings: Secondary | ICD-10-CM | POA: Diagnosis not present

## 2016-01-13 DIAGNOSIS — Z0000 Encounter for general adult medical examination without abnormal findings: Secondary | ICD-10-CM | POA: Diagnosis not present

## 2016-01-13 DIAGNOSIS — M859 Disorder of bone density and structure, unspecified: Secondary | ICD-10-CM | POA: Diagnosis not present

## 2016-01-13 DIAGNOSIS — Z23 Encounter for immunization: Secondary | ICD-10-CM | POA: Diagnosis not present

## 2016-01-13 DIAGNOSIS — M79672 Pain in left foot: Secondary | ICD-10-CM | POA: Diagnosis not present

## 2016-01-13 DIAGNOSIS — R3121 Asymptomatic microscopic hematuria: Secondary | ICD-10-CM | POA: Diagnosis not present

## 2016-01-13 DIAGNOSIS — F325 Major depressive disorder, single episode, in full remission: Secondary | ICD-10-CM | POA: Diagnosis not present

## 2016-01-20 DIAGNOSIS — Z1212 Encounter for screening for malignant neoplasm of rectum: Secondary | ICD-10-CM | POA: Diagnosis not present

## 2016-03-15 DIAGNOSIS — R3121 Asymptomatic microscopic hematuria: Secondary | ICD-10-CM | POA: Diagnosis not present

## 2016-03-27 DIAGNOSIS — R3129 Other microscopic hematuria: Secondary | ICD-10-CM | POA: Diagnosis not present

## 2016-03-27 DIAGNOSIS — R3121 Asymptomatic microscopic hematuria: Secondary | ICD-10-CM | POA: Diagnosis not present

## 2016-03-28 DIAGNOSIS — R311 Benign essential microscopic hematuria: Secondary | ICD-10-CM | POA: Diagnosis not present

## 2016-05-15 DIAGNOSIS — R350 Frequency of micturition: Secondary | ICD-10-CM | POA: Diagnosis not present

## 2016-05-17 ENCOUNTER — Ambulatory Visit: Payer: BLUE CROSS/BLUE SHIELD | Admitting: Obstetrics and Gynecology

## 2016-06-05 ENCOUNTER — Ambulatory Visit: Payer: BLUE CROSS/BLUE SHIELD | Admitting: Obstetrics and Gynecology

## 2016-06-06 DIAGNOSIS — C44712 Basal cell carcinoma of skin of right lower limb, including hip: Secondary | ICD-10-CM | POA: Diagnosis not present

## 2016-06-06 DIAGNOSIS — D1801 Hemangioma of skin and subcutaneous tissue: Secondary | ICD-10-CM | POA: Diagnosis not present

## 2016-06-06 DIAGNOSIS — L821 Other seborrheic keratosis: Secondary | ICD-10-CM | POA: Diagnosis not present

## 2016-06-06 DIAGNOSIS — Z85828 Personal history of other malignant neoplasm of skin: Secondary | ICD-10-CM | POA: Diagnosis not present

## 2016-06-06 DIAGNOSIS — L814 Other melanin hyperpigmentation: Secondary | ICD-10-CM | POA: Diagnosis not present

## 2016-06-06 DIAGNOSIS — D485 Neoplasm of uncertain behavior of skin: Secondary | ICD-10-CM | POA: Diagnosis not present

## 2016-06-06 DIAGNOSIS — L570 Actinic keratosis: Secondary | ICD-10-CM | POA: Diagnosis not present

## 2016-06-19 ENCOUNTER — Encounter: Payer: Self-pay | Admitting: Obstetrics and Gynecology

## 2016-06-19 ENCOUNTER — Ambulatory Visit (INDEPENDENT_AMBULATORY_CARE_PROVIDER_SITE_OTHER): Payer: BLUE CROSS/BLUE SHIELD | Admitting: Obstetrics and Gynecology

## 2016-06-19 VITALS — BP 124/62 | HR 64 | Resp 16 | Ht 62.75 in | Wt 107.0 lb

## 2016-06-19 DIAGNOSIS — Z01419 Encounter for gynecological examination (general) (routine) without abnormal findings: Secondary | ICD-10-CM | POA: Diagnosis not present

## 2016-06-19 NOTE — Progress Notes (Signed)
66 y.o. [id number] Married Caucasian female here for annual exam.    No vaginal bleeding.   Not very sexually active.   Had a UTI for the first time in 1 - 2 years.  Does have a hx of UTIs.  Had microscopic hematuria.  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  No further work up needed. 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   New dx of osteoporosis.   PCP: Dr. Brigitte Pulse  - [facility]  Patient's last menstrual period was 02/06/2002 (approximate).           Sexually active: Yes.    The current method of family planning is tubal ligation.    Exercising: No.  The patient does not participate in regular exercise at present. Smoker:  no  Health Maintenance: Pap: 04-30-15 Neg:Neg HR HPV;03-20-12 Neg:Neg HR HPV History of abnormal Pap:  no MMG: 12-17-15 Density D/Neg/BiRads1:TBC. Colonoscopy: 12/15/14, Tubular Adenoma, repeat in 3 years BMD:   2017 per patient done w/ PCP  Result  Osteoporosis per patient.  Off Fosamax now.  TDaP: PCP Gardasil:  no HIV: none Hep C: none Screening Labs:  Hb today: PCP takes care of all labs   reports that she has quit smoking. She has never used smokeless tobacco. She reports that she drinks about 8.4 oz of alcohol per week . She reports that she does not use drugs.  Past Medical History:  Diagnosis Date  . Allergy   . Anxiety   . Blood transfusion without reported diagnosis    w/nephrectomy at age 40  . Dyspareunia   . Frequent UTI    Hematuria  . 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 Osteopenia     Past Surgical History:  Procedure Laterality Date  . APPENDECTOMY    . COLONOSCOPY  01-21-2004   with brodie Normal  . NEPHRECTOMY     --age 77  . TUBAL LIGATION      Current Outpatient Prescriptions  Medication Sig Dispense Refill  . Calcium Carb-Cholecalciferol (CALCIUM 600 + D PO) Take by mouth daily.    . Cholecalciferol (VITAMIN D PO) Take 1,000 Units by mouth daily. Vitamin D    .  escitalopram (LEXAPRO) 20 MG tablet Take 20 mg by mouth daily.    . fluorouracil (EFUDEX) 5 % cream Apply topically 2 (two) times daily.    . fluticasone (FLONASE) 50 MCG/ACT nasal spray Place 1 spray into both nostrils as needed.    . tretinoin (RETIN-A) 0.1 % cream as needed.  2  . Zolpidem Tartrate (AMBIEN PO) Take 5 mg by mouth as needed. Ambien     No current facility-administered medications for this visit.     Family History  Problem Relation Age of Onset  . Hypertension Mother   . Osteoporosis Mother   . Anxiety disorder Sister   . Hyperlipidemia Father   . Diabetes Maternal Grandfather   . Stroke Maternal Grandfather   . Colon cancer Neg Hx   . Colon polyps Neg Hx   . Rectal cancer Neg Hx   . Stomach cancer Neg Hx     ROS:  Pertinent items are noted in HPI.  Otherwise, a comprehensive ROS was negative.  Exam:   BP 124/62 (BP Location: Right Arm, Patient Position: Sitting, Cuff Size: Normal)   Pulse 64   Resp 16   Ht 5' 2.75" (1.594 m)   Wt 107 lb (48.5 kg)  LMP 02/06/2002 (Approximate)   BMI 19.11 kg/m     General appearance: alert, cooperative and appears stated age Head: Normocephalic, without obvious abnormality, atraumatic Neck: no adenopathy, supple, symmetrical, trachea midline and thyroid normal to inspection and palpation Lungs: clear to auscultation bilaterally Breasts: normal appearance, no masses or tenderness, No nipple retraction or dimpling, No nipple discharge or bleeding, No axillary or supraclavicular adenopathy Heart: regular rate and rhythm Abdomen: abdominal scars noted, soft, non-tender; no masses, no organomegaly Extremities: extremities normal, atraumatic, no cyanosis or edema Skin: Skin color, texture, turgor normal. No rashes or lesions Lymph nodes: Cervical, supraclavicular, and axillary nodes normal. No abnormal inguinal nodes palpated Neurologic: Grossly normal  Pelvic: External genitalia:  no lesions              Urethra:  normal  appearing urethra with no masses, tenderness or lesions              Bartholins and Skenes: normal                 Vagina: normal appearing vagina with normal color and discharge, no lesions              Cervix: no lesions              Pap taken: No. Bimanual Exam:  Uterus:  normal size, contour, position, consistency, mobility, non-tender              Adnexa: no mass, fullness, tenderness              Rectal exam: Yes.  .  Confirms.              Anus:  normal sphincter tone, no lesions  Chaperone was present for exam.  Assessment:   Well woman visit with normal exam. Osteoporosis.  Status post Fosamax. Hx UTIs.   Plan: Mammogram screening discussed. Recommended self breast awareness. Pap and HR HPV as above. Guidelines for Calcium, Vitamin D, regular exercise program including cardiovascular and weight bearing exercise. Discussion of osteoporosis.  We talked about alternatives to bisphosphonates, including Prolia.  She will study this and contact her PCP to see if this is appropriate for her.   We talked about local vaginal estrogen to the urethra if she has recurrent UTIs.  She declines this for vaginal atrophy.  Follow up annually and prn.    After visit summary provided.

## 2016-06-19 NOTE — Patient Instructions (Addendum)
EXERCISE AND DIET:  We recommended that you start or continue a regular exercise program for good health. Regular exercise means any activity that makes your heart beat faster and makes you sweat.  We recommend exercising at least 30 minutes per day at least 3 days a week, preferably 4 or 5.  We also recommend a diet low in fat and sugar.  Inactivity, poor dietary choices and obesity can cause diabetes, heart attack, stroke, and kidney damage, among others.    ALCOHOL AND SMOKING:  Women should limit their alcohol intake to no more than 7 drinks/beers/glasses of wine (combined, not each!) per week. Moderation of alcohol intake to this level decreases your risk of breast cancer and liver damage. And of course, no recreational drugs are part of a healthy lifestyle.  And absolutely no smoking or even second hand smoke. Most people know smoking can cause heart and lung diseases, but did you know it also contributes to weakening of your bones? Aging of your skin?  Yellowing of your teeth and nails?  CALCIUM AND VITAMIN D:  Adequate intake of calcium and Vitamin D are recommended.  The recommendations for exact amounts of these supplements seem to change often, but generally speaking 600 mg of calcium (either carbonate or citrate) and 800 units of Vitamin D per day seems prudent. Certain women may benefit from higher intake of Vitamin D.  If you are among these women, your doctor will have told you during your visit.    PAP SMEARS:  Pap smears, to check for cervical cancer or precancers,  have traditionally been done yearly, although recent scientific advances have shown that most women can have pap smears less often.  However, every woman still should have a physical exam from her gynecologist every year. It will include a breast check, inspection of the vulva and vagina to check for abnormal growths or skin changes, a visual exam of the cervix, and then an exam to evaluate the size and shape of the uterus and  ovaries.  And after 64 years of age, a rectal exam is indicated to check for rectal cancers. We will also provide age appropriate advice regarding health maintenance, like when you should have certain vaccines, screening for sexually transmitted diseases, bone density testing, colonoscopy, mammograms, etc.   MAMMOGRAMS:  All women over 52 years old should have a yearly mammogram. Many facilities now offer a "3D" mammogram, which may cost around $50 extra out of pocket. If possible,  we recommend you accept the option to have the 3D mammogram performed.  It both reduces the number of women who will be called back for extra views which then turn out to be normal, and it is better than the routine mammogram at detecting truly abnormal areas.    COLONOSCOPY:  Colonoscopy to screen for colon cancer is recommended for all women at age 18.  We know, you hate the idea of the prep.  We agree, BUT, having colon cancer and not knowing it is worse!!  Colon cancer so often starts as a polyp that can be seen and removed at colonscopy, which can quite literally save your life!  And if your first colonoscopy is normal and you have no family history of colon cancer, most women don't have to have it again for 10 years.  Once every ten years, you can do something that may end up saving your life, right?  We will be happy to help you get it scheduled when you are ready.  Be sure to check your insurance coverage so you understand how much it will cost.  It may be covered as a preventative service at no cost, but you should check your particular policy.     Denosumab injection What is this medicine? DENOSUMAB (den oh sue mab) slows bone breakdown. Prolia is used to treat osteoporosis in women after menopause and in men. Xgeva is used to treat a high calcium level due to cancer and to prevent bone fractures and other bone problems caused by multiple myeloma or cancer bone metastases. Xgeva is also used to treat giant cell tumor  of the bone. This medicine may be used for other purposes; ask your health care provider or pharmacist if you have questions. COMMON BRAND NAME(S): Prolia, XGEVA What should I tell my health care provider before I take this medicine? They need to know if you have any of these conditions: -dental disease -having surgery or tooth extraction -infection -kidney disease -low levels of calcium or Vitamin D in the blood -malnutrition -on hemodialysis -skin conditions or sensitivity -thyroid or parathyroid disease -an unusual reaction to denosumab, other medicines, foods, dyes, or preservatives -pregnant or trying to get pregnant -breast-feeding How should I use this medicine? This medicine is for injection under the skin. It is given by a health care professional in a hospital or clinic setting. If you are getting Prolia, a special MedGuide will be given to you by the pharmacist with each prescription and refill. Be sure to read this information carefully each time. For Prolia, talk to your pediatrician regarding the use of this medicine in children. Special care may be needed. For Xgeva, talk to your pediatrician regarding the use of this medicine in children. While this drug may be prescribed for children as young as 13 years for selected conditions, precautions do apply. Overdosage: If you think you have taken too much of this medicine contact a poison control center or emergency room at once. NOTE: This medicine is only for you. Do not share this medicine with others. What if I miss a dose? It is important not to miss your dose. Call your doctor or health care professional if you are unable to keep an appointment. What may interact with this medicine? Do not take this medicine with any of the following medications: -other medicines containing denosumab This medicine may also interact with the following medications: -medicines that lower your chance of fighting infection -steroid medicines  like prednisone or cortisone This list may not describe all possible interactions. Give your health care provider a list of all the medicines, herbs, non-prescription drugs, or dietary supplements you use. Also tell them if you smoke, drink alcohol, or use illegal drugs. Some items may interact with your medicine. What should I watch for while using this medicine? Visit your doctor or health care professional for regular checks on your progress. Your doctor or health care professional may order blood tests and other tests to see how you are doing. Call your doctor or health care professional for advice if you get a fever, chills or sore throat, or other symptoms of a cold or flu. Do not treat yourself. This drug may decrease your body's ability to fight infection. Try to avoid being around people who are sick. You should make sure you get enough calcium and vitamin D while you are taking this medicine, unless your doctor tells you not to. Discuss the foods you eat and the vitamins you take with your health care professional. See   your dentist regularly. Brush and floss your teeth as directed. Before you have any dental work done, tell your dentist you are receiving this medicine. Do not become pregnant while taking this medicine or for 5 months after stopping it. Talk with your doctor or health care professional about your birth control options while taking this medicine. Women should inform their doctor if they wish to become pregnant or think they might be pregnant. There is a potential for serious side effects to an unborn child. Talk to your health care professional or pharmacist for more information. What side effects may I notice from receiving this medicine? Side effects that you should report to your doctor or health care professional as soon as possible: -allergic reactions like skin rash, itching or hives, swelling of the face, lips, or tongue -bone pain -breathing problems -dizziness -jaw  pain, especially after dental work -redness, blistering, peeling of the skin -signs and symptoms of infection like fever or chills; cough; sore throat; pain or trouble passing urine -signs of low calcium like fast heartbeat, muscle cramps or muscle pain; pain, tingling, numbness in the hands or feet; seizures -unusual bleeding or bruising -unusually weak or tired Side effects that usually do not require medical attention (report to your doctor or health care professional if they continue or are bothersome): -constipation -diarrhea -headache -joint pain -loss of appetite -muscle pain -runny nose -tiredness -upset stomach This list may not describe all possible side effects. Call your doctor for medical advice about side effects. You may report side effects to FDA at 1-800-FDA-1088. Where should I keep my medicine? This medicine is only given in a clinic, doctor's office, or other health care setting and will not be stored at home. NOTE: This sheet is a summary. It may not cover all possible information. If you have questions about this medicine, talk to your doctor, pharmacist, or health care provider.  2018 Elsevier/Gold Standard (2016-02-15 19:17:21)  Estradiol vaginal cream What is this medicine? ESTRADIOL (es tra DYE ole) contains the female hormone estrogen. It is used for symptoms of menopause, like vaginal dryness and irritation. This medicine may be used for other purposes; ask your health care provider or pharmacist if you have questions. COMMON BRAND NAME(S): Estrace What should I tell my health care provider before I take this medicine? They need to know if you have any of these conditions: -abnormal vaginal bleeding -blood vessel disease or blood clots -breast, cervical, endometrial, ovarian, liver, or uterine cancer -dementia -diabetes -gallbladder disease -heart disease or recent heart attack -high blood pressure -high cholesterol -high levels of calcium in the  blood -hysterectomy -kidney disease -liver disease -migraine headaches -protein C deficiency -protein S deficiency -stroke -systemic lupus erythematosus (SLE) -tobacco smoker -an unusual or allergic reaction to estrogens, other hormones, soy, other medicines, foods, dyes, or preservatives -pregnant or trying to get pregnant -breast-feeding How should I use this medicine? This medicine is for use in the vagina only. Do not take by mouth. Follow the directions on the prescription label. Read package directions carefully before using. Use the special applicator supplied with the cream. Wash hands before and after use. Fill the applicator with the prescribed amount of cream. Lie on your back, part and bend your knees. Insert the applicator into the vagina and push the plunger to expel the cream into the vagina. Wash the applicator with warm soapy water and rinse well. Use exactly as directed for the complete length of time prescribed. Do not stop using except on  the advice of your doctor or health care professional. A patient package insert for the product will be given with each prescription and refill. Read this sheet carefully each time. The sheet may change frequently. Talk to your pediatrician regarding the use of this medicine in children. This medicine is not approved for use in children. Overdosage: If you think you have taken too much of this medicine contact a poison control center or emergency room at once. NOTE: This medicine is only for you. Do not share this medicine with others. What if I miss a dose? If you miss a dose, use it as soon as you can. If it is almost time for your next dose, use only that dose. Do not use double or extra doses. What may interact with this medicine? Do not take this medicine with any of the following medications: -aromatase inhibitors like aminoglutethimide, anastrozole, exemestane, letrozole, testolactone This medicine may also interact with the  following medications: -barbiturates used for inducing sleep or treating seizures -carbamazepine -grapefruit juice -medicines for fungal infections like ketoconazole and itraconazole -raloxifene -rifabutin -rifampin -rifapentine -ritonavir -some antibiotics used to treat infections -St. John's Wort -tamoxifen -warfarin This list may not describe all possible interactions. Give your health care provider a list of all the medicines, herbs, non-prescription drugs, or dietary supplements you use. Also tell them if you smoke, drink alcohol, or use illegal drugs. Some items may interact with your medicine. What should I watch for while using this medicine? Visit your health care professional for regular checks on your progress. You will need a regular breast and pelvic exam. You should also discuss the need for regular mammograms with your health care professional, and follow his or her guidelines. This medicine can make your body retain fluid, making your fingers, hands, or ankles swell. Your blood pressure can go up. Contact your doctor or health care professional if you feel you are retaining fluid. If you have any reason to think you are pregnant, stop taking this medicine at once and contact your doctor or health care professional. Tobacco smoking increases the risk of getting a blood clot or having a stroke, especially if you are more than 64 years old. You are strongly advised not to smoke. If you wear contact lenses and notice visual changes, or if the lenses begin to feel uncomfortable, consult your eye care specialist. If you are going to have elective surgery, you may need to stop taking this medicine beforehand. Consult your health care professional for advice prior to scheduling the surgery. What side effects may I notice from receiving this medicine? Side effects that you should report to your doctor or health care professional as soon as possible: -allergic reactions like skin rash,  itching or hives, swelling of the face, lips, or tongue -breast tissue changes or discharge -changes in vision -chest pain -confusion, trouble speaking or understanding -dark urine -general ill feeling or flu-like symptoms -light-colored stools -nausea, vomiting -pain, swelling, warmth in the leg -right upper belly pain -severe headaches -shortness of breath -sudden numbness or weakness of the face, arm or leg -trouble walking, dizziness, loss of balance or coordination -unusual vaginal bleeding -yellowing of the eyes or skin Side effects that usually do not require medical attention (report to your doctor or health care professional if they continue or are bothersome): -hair loss -increased hunger or thirst -increased urination -symptoms of vaginal infection like itching, irritation or unusual discharge -unusually weak or tired This list may not describe all possible side  effects. Call your doctor for medical advice about side effects. You may report side effects to FDA at 1-800-FDA-1088. Where should I keep my medicine? Keep out of the reach of children. Store at room temperature between 15 and 30 degrees C (59 and 86 degrees F). Protect from temperatures above 40 degrees C (104 degrees C). Do not freeze. Throw away any unused medicine after the expiration date. NOTE: This sheet is a summary. It may not cover all possible information. If you have questions about this medicine, talk to your doctor, pharmacist, or health care provider.  2018 Elsevier/Gold Standard (2010-04-27 09:18:12)

## 2016-07-31 DIAGNOSIS — L723 Sebaceous cyst: Secondary | ICD-10-CM | POA: Diagnosis not present

## 2016-08-03 DIAGNOSIS — L723 Sebaceous cyst: Secondary | ICD-10-CM | POA: Diagnosis not present

## 2016-11-03 DIAGNOSIS — Z23 Encounter for immunization: Secondary | ICD-10-CM | POA: Diagnosis not present

## 2016-11-13 ENCOUNTER — Other Ambulatory Visit: Payer: Self-pay | Admitting: Internal Medicine

## 2016-11-13 DIAGNOSIS — Z1231 Encounter for screening mammogram for malignant neoplasm of breast: Secondary | ICD-10-CM

## 2017-01-17 DIAGNOSIS — R350 Frequency of micturition: Secondary | ICD-10-CM | POA: Diagnosis not present

## 2017-01-17 DIAGNOSIS — M859 Disorder of bone density and structure, unspecified: Secondary | ICD-10-CM | POA: Diagnosis not present

## 2017-01-17 DIAGNOSIS — Z0000 Encounter for general adult medical examination without abnormal findings: Secondary | ICD-10-CM | POA: Diagnosis not present

## 2017-01-22 DIAGNOSIS — R3121 Asymptomatic microscopic hematuria: Secondary | ICD-10-CM | POA: Diagnosis not present

## 2017-01-22 DIAGNOSIS — L568 Other specified acute skin changes due to ultraviolet radiation: Secondary | ICD-10-CM | POA: Diagnosis not present

## 2017-01-22 DIAGNOSIS — Z1389 Encounter for screening for other disorder: Secondary | ICD-10-CM | POA: Diagnosis not present

## 2017-01-22 DIAGNOSIS — M8580 Other specified disorders of bone density and structure, unspecified site: Secondary | ICD-10-CM | POA: Diagnosis not present

## 2017-01-22 DIAGNOSIS — Z1212 Encounter for screening for malignant neoplasm of rectum: Secondary | ICD-10-CM | POA: Diagnosis not present

## 2017-01-22 DIAGNOSIS — F3342 Major depressive disorder, recurrent, in full remission: Secondary | ICD-10-CM | POA: Diagnosis not present

## 2017-01-22 DIAGNOSIS — Z23 Encounter for immunization: Secondary | ICD-10-CM | POA: Diagnosis not present

## 2017-01-22 DIAGNOSIS — Z0000 Encounter for general adult medical examination without abnormal findings: Secondary | ICD-10-CM | POA: Diagnosis not present

## 2017-02-12 ENCOUNTER — Ambulatory Visit
Admission: RE | Admit: 2017-02-12 | Discharge: 2017-02-12 | Disposition: A | Discharge status: Home / Self Care | Payer: BLUE CROSS/BLUE SHIELD | Source: Ambulatory Visit | Attending: Internal Medicine | Admitting: Internal Medicine

## 2017-02-12 DIAGNOSIS — Z1231 Encounter for screening mammogram for malignant neoplasm of breast: Secondary | ICD-10-CM

## 2017-05-09 DIAGNOSIS — R05 Cough: Secondary | ICD-10-CM | POA: Diagnosis not present

## 2017-05-09 DIAGNOSIS — J111 Influenza due to unidentified influenza virus with other respiratory manifestations: Secondary | ICD-10-CM | POA: Diagnosis not present

## 2017-05-09 DIAGNOSIS — Z681 Body mass index (BMI) 19 or less, adult: Secondary | ICD-10-CM | POA: Diagnosis not present

## 2017-05-22 DIAGNOSIS — R300 Dysuria: Secondary | ICD-10-CM | POA: Diagnosis not present

## 2017-06-06 DIAGNOSIS — L821 Other seborrheic keratosis: Secondary | ICD-10-CM | POA: Diagnosis not present

## 2017-06-06 DIAGNOSIS — D485 Neoplasm of uncertain behavior of skin: Secondary | ICD-10-CM | POA: Diagnosis not present

## 2017-06-06 DIAGNOSIS — Z85828 Personal history of other malignant neoplasm of skin: Secondary | ICD-10-CM | POA: Diagnosis not present

## 2017-06-06 DIAGNOSIS — D225 Melanocytic nevi of trunk: Secondary | ICD-10-CM | POA: Diagnosis not present

## 2017-06-06 DIAGNOSIS — Q828 Other specified congenital malformations of skin: Secondary | ICD-10-CM | POA: Diagnosis not present

## 2017-06-06 DIAGNOSIS — L570 Actinic keratosis: Secondary | ICD-10-CM | POA: Diagnosis not present

## 2017-06-06 DIAGNOSIS — D2262 Melanocytic nevi of left upper limb, including shoulder: Secondary | ICD-10-CM | POA: Diagnosis not present

## 2017-07-09 ENCOUNTER — Encounter: Payer: Self-pay | Admitting: Obstetrics and Gynecology

## 2017-07-09 ENCOUNTER — Ambulatory Visit: Payer: BLUE CROSS/BLUE SHIELD | Admitting: Obstetrics and Gynecology

## 2017-07-09 NOTE — Progress Notes (Deleted)
67 y.o. G34P0003 Married Caucasian female here for annual exam.    PCP:     Patient's last menstrual period was 02/06/2002 (approximate).           Sexually active: {yes no:314532}  The current method of family planning is tubal ligation.    Exercising: {yes no:314532}  {types:19826} Smoker:  no  Health Maintenance: Pap:  04-30-15 Neg:Neg HR HPV History of abnormal Pap:  no MMG:  02/12/17 BIRADS 1 negative/density c Colonoscopy:  12/15/14, Tubular Adenoma, repeat in 3 years BMD:   2017  Result  Osteoporosis TDaP:  *** Gardasil:   n/a HIV: Hep C: Screening Labs:  Hb today: ***, Urine today: ***   reports that she has quit smoking. She has never used smokeless tobacco. She reports that she drinks about 8.4 oz of alcohol per week. She reports that she does not use drugs.  Past Medical History:  Diagnosis Date  . Allergy   . Anxiety   . Blood transfusion without reported diagnosis    w/nephrectomy at age 63  . Dyspareunia   . Frequent UTI    Hematuria  . 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 Osteopenia     Past Surgical History:  Procedure Laterality Date  . APPENDECTOMY    . COLONOSCOPY  01-21-2004   with brodie Normal  . NEPHRECTOMY     --age 54  . TUBAL LIGATION      Current Outpatient Medications  Medication Sig Dispense Refill  . Calcium Carb-Cholecalciferol (CALCIUM 600 + D PO) Take by mouth daily.    . Cholecalciferol (VITAMIN D PO) Take 1,000 Units by mouth daily. Vitamin D    . escitalopram (LEXAPRO) 20 MG tablet Take 20 mg by mouth daily.    . fluorouracil (EFUDEX) 5 % cream Apply topically 2 (two) times daily.    . fluticasone (FLONASE) 50 MCG/ACT nasal spray Place 1 spray into both nostrils as needed.    . tretinoin (RETIN-A) 0.1 % cream as needed.  2  . Zolpidem Tartrate (AMBIEN PO) Take 5 mg by mouth as needed. Ambien     No current facility-administered medications for this visit.     Family History  Problem Relation Age of Onset  .  Hypertension Mother   . Osteoporosis Mother   . Anxiety disorder Sister   . Hyperlipidemia Father   . Diabetes Maternal Grandfather   . Stroke Maternal Grandfather   . Colon cancer Neg Hx   . Colon polyps Neg Hx   . Rectal cancer Neg Hx   . Stomach cancer Neg Hx     Review of Systems  Exam:   LMP 02/06/2002 (Approximate)     General appearance: alert, cooperative and appears stated age Head: Normocephalic, without obvious abnormality, atraumatic Neck: no adenopathy, supple, symmetrical, trachea midline and thyroid normal to inspection and palpation Lungs: clear to auscultation bilaterally Breasts: normal appearance, no masses or tenderness, No nipple retraction or dimpling, No nipple discharge or bleeding, No axillary or supraclavicular adenopathy Heart: regular rate and rhythm Abdomen: soft, non-tender; no masses, no organomegaly Extremities: extremities normal, atraumatic, no cyanosis or edema Skin: Skin color, texture, turgor normal. No rashes or lesions Lymph nodes: Cervical, supraclavicular, and axillary nodes normal. No abnormal inguinal nodes palpated Neurologic: Grossly normal  Pelvic: External genitalia:  no lesions              Urethra:  normal appearing urethra with no masses, tenderness or lesions  Bartholins and Skenes: normal                 Vagina: normal appearing vagina with normal color and discharge, no lesions              Cervix: no lesions              Pap taken: {yes no:314532} Bimanual Exam:  Uterus:  normal size, contour, position, consistency, mobility, non-tender              Adnexa: no mass, fullness, tenderness              Rectal exam: {yes no:314532}.  Confirms.              Anus:  normal sphincter tone, no lesions  Chaperone was present for exam.  Assessment:   Well woman visit with normal exam.   Plan: Mammogram screening. Recommended self breast awareness. Pap and HR HPV as above. Guidelines for Calcium, Vitamin D,  regular exercise program including cardiovascular and weight bearing exercise.   Follow up annually and prn.   Additional counseling given.  {yes [id number]. _______ minutes face to face time of which over 50% was spent in counseling.    After visit summary provided.

## 2017-08-30 DIAGNOSIS — R591 Generalized enlarged lymph nodes: Secondary | ICD-10-CM | POA: Diagnosis not present

## 2017-08-30 DIAGNOSIS — H6590 Unspecified nonsuppurative otitis media, unspecified ear: Secondary | ICD-10-CM | POA: Diagnosis not present

## 2017-08-30 DIAGNOSIS — J309 Allergic rhinitis, unspecified: Secondary | ICD-10-CM | POA: Diagnosis not present

## 2017-10-13 DIAGNOSIS — Z23 Encounter for immunization: Secondary | ICD-10-CM | POA: Diagnosis not present

## 2017-10-16 DIAGNOSIS — J309 Allergic rhinitis, unspecified: Secondary | ICD-10-CM | POA: Diagnosis not present

## 2017-10-16 DIAGNOSIS — H6590 Unspecified nonsuppurative otitis media, unspecified ear: Secondary | ICD-10-CM | POA: Diagnosis not present

## 2017-10-16 DIAGNOSIS — Z681 Body mass index (BMI) 19 or less, adult: Secondary | ICD-10-CM | POA: Diagnosis not present

## 2017-10-22 DIAGNOSIS — H6590 Unspecified nonsuppurative otitis media, unspecified ear: Secondary | ICD-10-CM | POA: Diagnosis not present

## 2017-11-05 NOTE — Progress Notes (Signed)
67 y.o. G13P0003 Married Caucasian female here for annual exam.    No vaginal bleeding or spotting.   Had her flu vaccine already.  Not liking the heat this summer.  PCP:   Marton Redwood  Patient's last menstrual period was 02/06/2002 (approximate).     Period Cycle (Days): (postmenopausal)     Sexually active: Yes.    The current method of family planning is post menopausal status.    Exercising: Yes.    daily, walk and strength Smoker:  Social smoker  Health Maintenance: Pap:  04/30/2015 negative pap and HR HPV, 02.12.2014 negative pap with negative HPV History of abnormal Pap:  no MMG:  02/12/2017 BI-RADS CATEGORY  1: Negative. Colonoscopy:  12/15/2014 history of polyp follow up in 3 years.  [place].  BMD:   2017 per patient done w/ PCP  Result  Osteoporosis per patient TDaP:  unsure Gardasil:   no KTG:YBWLSL Hep C:unsure Screening Labs:Done with PCP     reports that she has been smoking. She has never used smokeless tobacco. She reports that she drinks about 14.0 standard drinks of alcohol per week. She reports that she does not use drugs.  Past Medical History:  Diagnosis Date  . Allergy   . Anxiety   . Blood transfusion without reported diagnosis    w/nephrectomy at age 35  . Dyspareunia   . Frequent UTI    Hematuria  . 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 Osteopenia     Past Surgical History:  Procedure Laterality Date  . APPENDECTOMY    . COLONOSCOPY  01-21-2004   with brodie Normal  . NEPHRECTOMY     --age 13  . TUBAL LIGATION      Current Outpatient Medications  Medication Sig Dispense Refill  . Calcium Carb-Cholecalciferol (CALCIUM 600 + D PO) Take by mouth daily.    . Cholecalciferol (VITAMIN D PO) Take 1,000 Units by mouth daily. Vitamin D    . escitalopram (LEXAPRO) 20 MG tablet Take 10 mg by mouth daily. Cuts a 20mg  in half    . fluorouracil (EFUDEX) 5 % cream Apply topically 2 (two) times daily.    . fluticasone (FLONASE) 50 MCG/ACT  nasal spray Place 1 spray into both nostrils as needed.    . tretinoin (RETIN-A) 0.1 % cream as needed.  2  . Zolpidem Tartrate (AMBIEN PO) Take 5 mg by mouth as needed. Ambien     No current facility-administered medications for this visit.     Family History  Problem Relation Age of Onset  . Hypertension Mother   . Osteoporosis Mother   . Anxiety disorder Sister   . Hyperlipidemia Father   . Diabetes Maternal Grandfather   . Stroke Maternal Grandfather   . Colon cancer Neg Hx   . Colon polyps Neg Hx   . Rectal cancer Neg Hx   . Stomach cancer Neg Hx     Review of Systems  Constitutional: Negative.   HENT: Negative.   Eyes: Negative.   Respiratory: Negative.   Cardiovascular: Negative.   Gastrointestinal: Negative.   Endocrine: Positive for cold intolerance and heat intolerance.       Craving sweets  Genitourinary: Negative.   Musculoskeletal: Negative.   Skin: Negative.   Allergic/Immunologic: Negative.   Neurological: Negative.   Hematological: Negative.   Psychiatric/Behavioral: Negative.   All other systems reviewed and are negative.   Exam:   BP 104/78   Pulse (!) 57  Ht 5' 2.75" (1.594 m)   Wt 105 lb (47.6 kg)   LMP 02/06/2002 (Approximate)   BMI 18.75 kg/m     General appearance: alert, cooperative and appears stated age Head: Normocephalic, without obvious abnormality, atraumatic Neck: no adenopathy, supple, symmetrical, trachea midline and thyroid normal to inspection and palpation Lungs: clear to auscultation bilaterally Breasts: normal appearance, no masses or tenderness, No nipple retraction or dimpling, No nipple discharge or bleeding, No axillary or supraclavicular adenopathy Heart: regular rate and rhythm Abdomen: soft, non-tender; no masses, no organomegaly Extremities: extremities normal, atraumatic, no cyanosis or edema Skin: Skin color, texture, turgor normal. No rashes or lesions Lymph nodes: Cervical, supraclavicular, and axillary nodes  normal. No abnormal inguinal nodes palpated Neurologic: Grossly normal  Pelvic: External genitalia:  no lesions              Urethra:  normal appearing urethra with no masses, tenderness or lesions              Bartholins and Skenes: normal                 Vagina: normal appearing vagina with normal color and discharge, no lesions              Cervix: no lesions              Pap taken: Yes.   Bimanual Exam:  Uterus:  normal size, contour, position, consistency, mobility, non-tender              Adnexa: no mass, fullness, tenderness              Rectal exam: Yes.  .  Confirms.              Anus:  normal sphincter tone, no lesions  Chaperone was present for exam.  Assessment:   Well woman visit with normal exam. Osteoporosis.  Off Fosamax.  Social smoker.   Plan: Mammogram screening. Recommended self breast awareness. Pap and HR HPV as above. Guidelines for Calcium, Vitamin D, regular exercise program including cardiovascular and weight bearing exercise. Will do BMD with PCP. Labs with PCP.  Smoking cessation recommended.  She will contact [facility] to schedule colonoscopy.  Follow up annually and prn.   After visit summary provided.

## 2017-11-07 ENCOUNTER — Encounter: Payer: Self-pay | Admitting: Obstetrics and Gynecology

## 2017-11-07 ENCOUNTER — Ambulatory Visit (INDEPENDENT_AMBULATORY_CARE_PROVIDER_SITE_OTHER): Payer: BLUE CROSS/BLUE SHIELD | Admitting: Obstetrics and Gynecology

## 2017-11-07 ENCOUNTER — Other Ambulatory Visit: Payer: Self-pay

## 2017-11-07 ENCOUNTER — Other Ambulatory Visit (HOSPITAL_COMMUNITY)
Admission: RE | Admit: 2017-11-07 | Discharge: 2017-11-07 | Disposition: A | Discharge status: Home / Self Care | Payer: BLUE CROSS/BLUE SHIELD | Source: Ambulatory Visit | Attending: Obstetrics and Gynecology | Admitting: Obstetrics and Gynecology

## 2017-11-07 VITALS — BP 104/78 | HR 57 | Ht 62.75 in | Wt 105.0 lb

## 2017-11-07 DIAGNOSIS — Z01419 Encounter for gynecological examination (general) (routine) without abnormal findings: Secondary | ICD-10-CM | POA: Insufficient documentation

## 2017-11-07 NOTE — Patient Instructions (Signed)

## 2017-11-08 DIAGNOSIS — Z7289 Other problems related to lifestyle: Secondary | ICD-10-CM | POA: Diagnosis not present

## 2017-11-08 DIAGNOSIS — Z0110 Encounter for examination of ears and hearing without abnormal findings: Secondary | ICD-10-CM | POA: Diagnosis not present

## 2017-11-08 DIAGNOSIS — J343 Hypertrophy of nasal turbinates: Secondary | ICD-10-CM | POA: Diagnosis not present

## 2017-11-08 DIAGNOSIS — H938X2 Other specified disorders of left ear: Secondary | ICD-10-CM | POA: Diagnosis not present

## 2017-11-08 DIAGNOSIS — H6982 Other specified disorders of Eustachian tube, left ear: Secondary | ICD-10-CM | POA: Diagnosis not present

## 2017-11-08 DIAGNOSIS — Z87891 Personal history of nicotine dependence: Secondary | ICD-10-CM | POA: Diagnosis not present

## 2017-11-09 LAB — CYTOLOGY - PAP
Diagnosis: NEGATIVE
HPV: NOT DETECTED

## 2017-11-12 ENCOUNTER — Telehealth: Payer: Self-pay

## 2017-11-12 NOTE — Telephone Encounter (Signed)
Left message informing patient of pap results.  02 recall entered.

## 2017-11-12 NOTE — Telephone Encounter (Signed)
-----   Message from Nunzio Cobbs, MD sent at 11/11/2017 10:35 AM EDT ----- Pap and HR HPV negative.  Pap recall - 02.

## 2017-12-10 DIAGNOSIS — Z0101 Encounter for examination of eyes and vision with abnormal findings: Secondary | ICD-10-CM | POA: Diagnosis not present

## 2018-01-07 DIAGNOSIS — M859 Disorder of bone density and structure, unspecified: Secondary | ICD-10-CM | POA: Diagnosis not present

## 2018-01-07 DIAGNOSIS — Z905 Acquired absence of kidney: Secondary | ICD-10-CM | POA: Diagnosis not present

## 2018-01-07 DIAGNOSIS — I10 Essential (primary) hypertension: Secondary | ICD-10-CM | POA: Diagnosis not present

## 2018-01-25 DIAGNOSIS — B999 Unspecified infectious disease: Secondary | ICD-10-CM | POA: Diagnosis not present

## 2018-01-25 DIAGNOSIS — Z681 Body mass index (BMI) 19 or less, adult: Secondary | ICD-10-CM | POA: Diagnosis not present

## 2018-01-27 ENCOUNTER — Encounter: Payer: Self-pay | Admitting: Internal Medicine

## 2018-02-06 DIAGNOSIS — S52302A Unspecified fracture of shaft of left radius, initial encounter for closed fracture: Secondary | ICD-10-CM | POA: Diagnosis not present

## 2018-02-06 HISTORY — PX: WRIST FRACTURE SURGERY: SHX121

## 2018-02-08 DIAGNOSIS — Z0000 Encounter for general adult medical examination without abnormal findings: Secondary | ICD-10-CM | POA: Diagnosis not present

## 2018-02-08 DIAGNOSIS — S52552A Other extraarticular fracture of lower end of left radius, initial encounter for closed fracture: Secondary | ICD-10-CM | POA: Diagnosis not present

## 2018-02-08 DIAGNOSIS — M8580 Other specified disorders of bone density and structure, unspecified site: Secondary | ICD-10-CM | POA: Diagnosis not present

## 2018-02-08 DIAGNOSIS — R82998 Other abnormal findings in urine: Secondary | ICD-10-CM | POA: Diagnosis not present

## 2018-02-14 ENCOUNTER — Other Ambulatory Visit: Payer: Self-pay | Admitting: Internal Medicine

## 2018-02-14 DIAGNOSIS — S52522D Torus fracture of lower end of left radius, subsequent encounter for fracture with routine healing: Secondary | ICD-10-CM | POA: Diagnosis not present

## 2018-02-14 DIAGNOSIS — Z1231 Encounter for screening mammogram for malignant neoplasm of breast: Secondary | ICD-10-CM

## 2018-02-15 DIAGNOSIS — Z23 Encounter for immunization: Secondary | ICD-10-CM | POA: Diagnosis not present

## 2018-02-15 DIAGNOSIS — R3121 Asymptomatic microscopic hematuria: Secondary | ICD-10-CM | POA: Diagnosis not present

## 2018-02-15 DIAGNOSIS — M859 Disorder of bone density and structure, unspecified: Secondary | ICD-10-CM | POA: Diagnosis not present

## 2018-02-15 DIAGNOSIS — Z1389 Encounter for screening for other disorder: Secondary | ICD-10-CM | POA: Diagnosis not present

## 2018-02-15 DIAGNOSIS — S5290XA Unspecified fracture of unspecified forearm, initial encounter for closed fracture: Secondary | ICD-10-CM | POA: Diagnosis not present

## 2018-02-15 DIAGNOSIS — Z0000 Encounter for general adult medical examination without abnormal findings: Secondary | ICD-10-CM | POA: Diagnosis not present

## 2018-02-15 DIAGNOSIS — I10 Essential (primary) hypertension: Secondary | ICD-10-CM | POA: Diagnosis not present

## 2018-02-18 DIAGNOSIS — G8918 Other acute postprocedural pain: Secondary | ICD-10-CM | POA: Diagnosis not present

## 2018-02-18 DIAGNOSIS — X58XXXA Exposure to other specified factors, initial encounter: Secondary | ICD-10-CM | POA: Diagnosis not present

## 2018-02-18 DIAGNOSIS — Y999 Unspecified external cause status: Secondary | ICD-10-CM | POA: Diagnosis not present

## 2018-02-18 DIAGNOSIS — S52572A Other intraarticular fracture of lower end of left radius, initial encounter for closed fracture: Secondary | ICD-10-CM | POA: Diagnosis not present

## 2018-02-18 DIAGNOSIS — S52532D Colles' fracture of left radius, subsequent encounter for closed fracture with routine healing: Secondary | ICD-10-CM | POA: Diagnosis not present

## 2018-03-05 DIAGNOSIS — S52522D Torus fracture of lower end of left radius, subsequent encounter for fracture with routine healing: Secondary | ICD-10-CM | POA: Diagnosis not present

## 2018-03-19 DIAGNOSIS — S52522D Torus fracture of lower end of left radius, subsequent encounter for fracture with routine healing: Secondary | ICD-10-CM | POA: Diagnosis not present

## 2018-04-05 DIAGNOSIS — M25632 Stiffness of left wrist, not elsewhere classified: Secondary | ICD-10-CM | POA: Diagnosis not present

## 2018-04-08 ENCOUNTER — Ambulatory Visit
Admission: RE | Admit: 2018-04-08 | Discharge: 2018-04-08 | Disposition: A | Discharge status: Home / Self Care | Payer: BLUE CROSS/BLUE SHIELD | Source: Ambulatory Visit | Attending: Internal Medicine | Admitting: Internal Medicine

## 2018-04-08 DIAGNOSIS — Z1231 Encounter for screening mammogram for malignant neoplasm of breast: Secondary | ICD-10-CM

## 2018-04-11 DIAGNOSIS — S52522D Torus fracture of lower end of left radius, subsequent encounter for fracture with routine healing: Secondary | ICD-10-CM | POA: Diagnosis not present

## 2018-04-12 ENCOUNTER — Encounter: Payer: Self-pay | Admitting: Internal Medicine

## 2018-04-19 DIAGNOSIS — M25632 Stiffness of left wrist, not elsewhere classified: Secondary | ICD-10-CM | POA: Diagnosis not present

## 2018-05-20 IMAGING — MG 2D DIGITAL SCREENING BILATERAL MAMMOGRAM WITH CAD AND ADJUNCT TO
9 of 12 series · 9 of 28 positions shown · non-contrast
Comparison: Previous exam(s).

CLINICAL DATA: Screening.

EXAM:
2D DIGITAL SCREENING BILATERAL MAMMOGRAM WITH CAD AND ADJUNCT TOMO

[L CC]
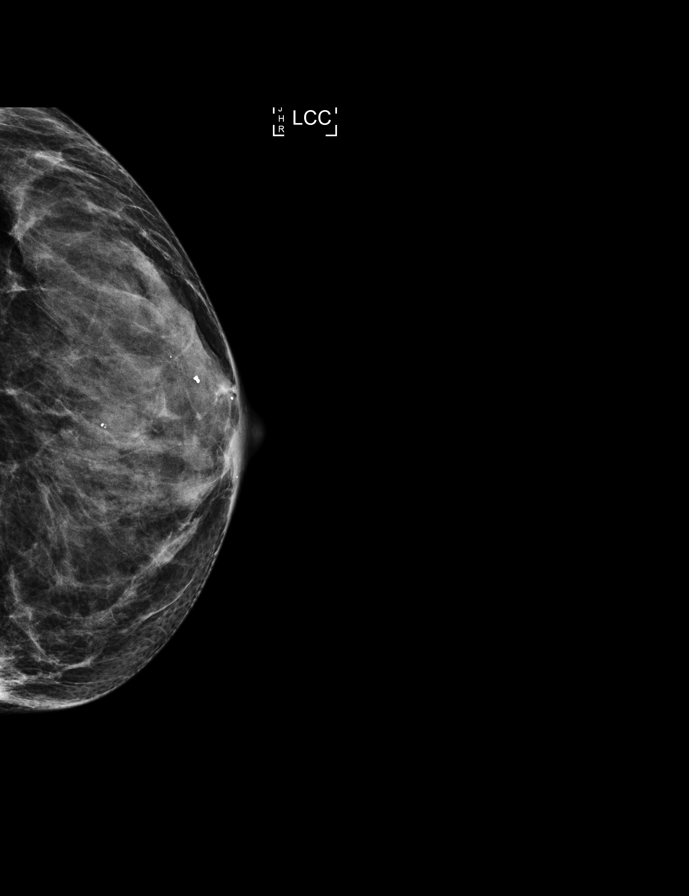

[R MLO]
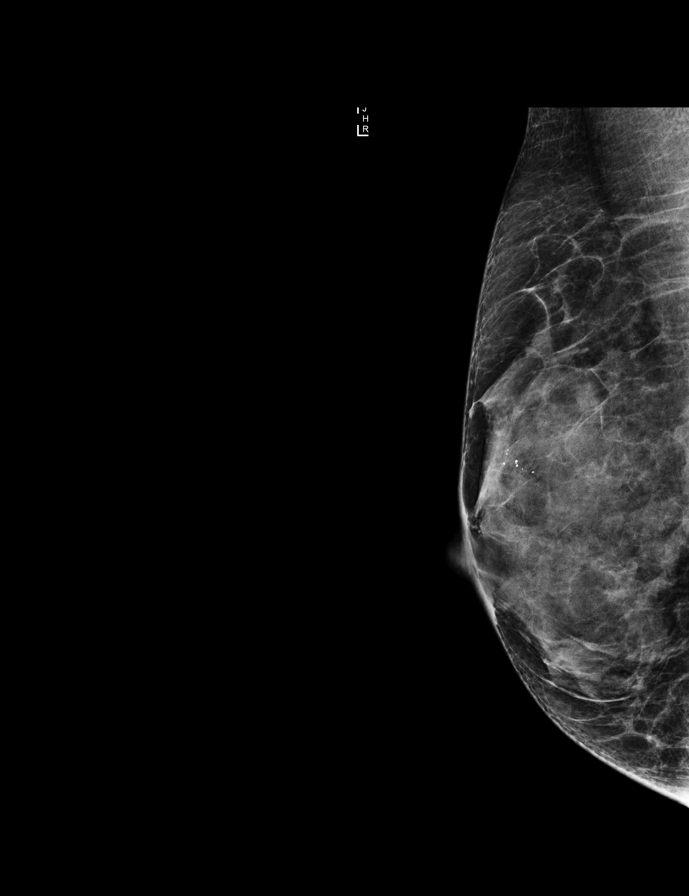

[L MLO]
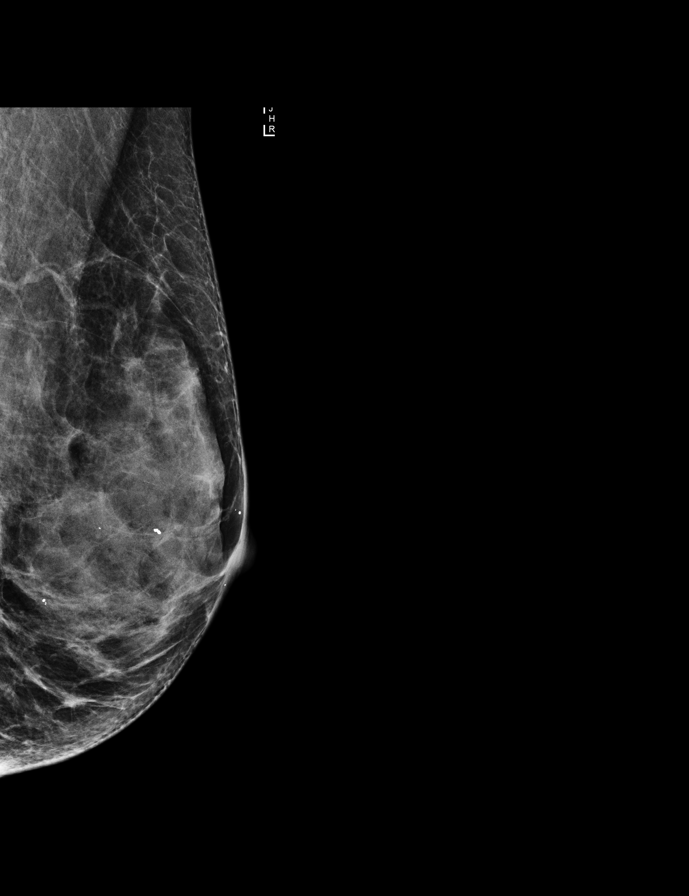

[R CC synth-2D]
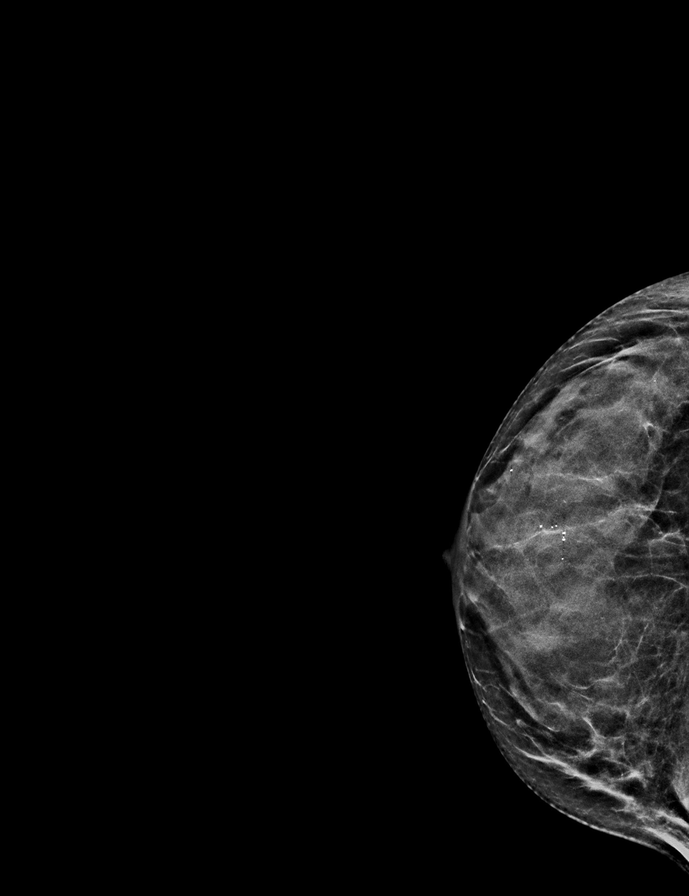

[L CC synth-2D]
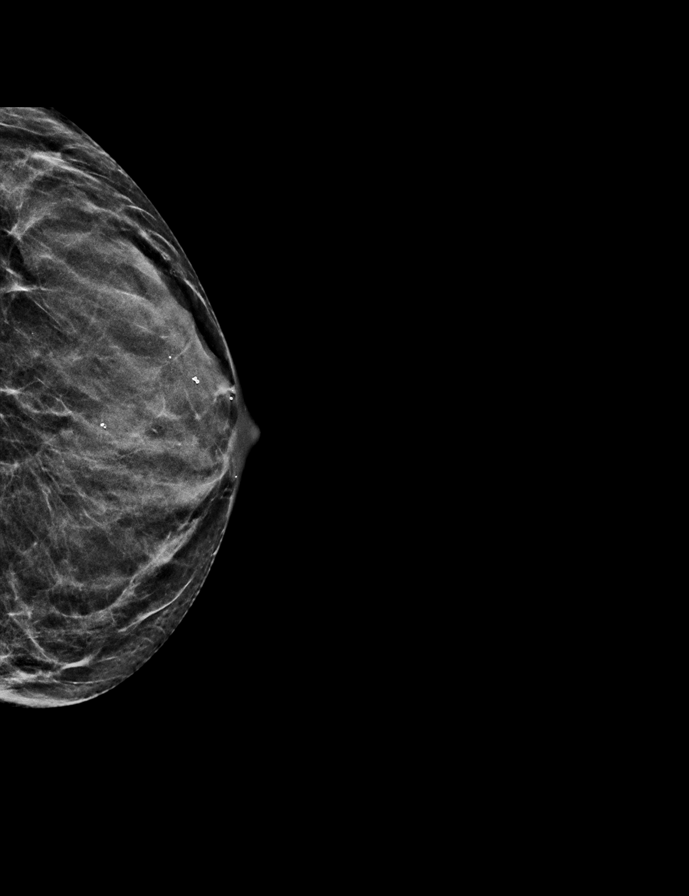

[R CC]
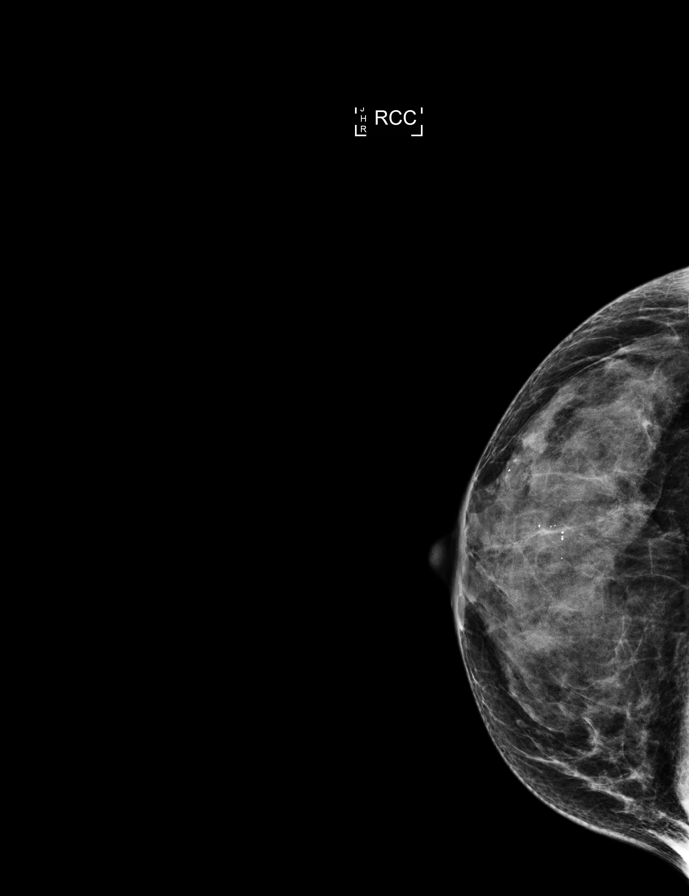

[L MLO synth-2D]
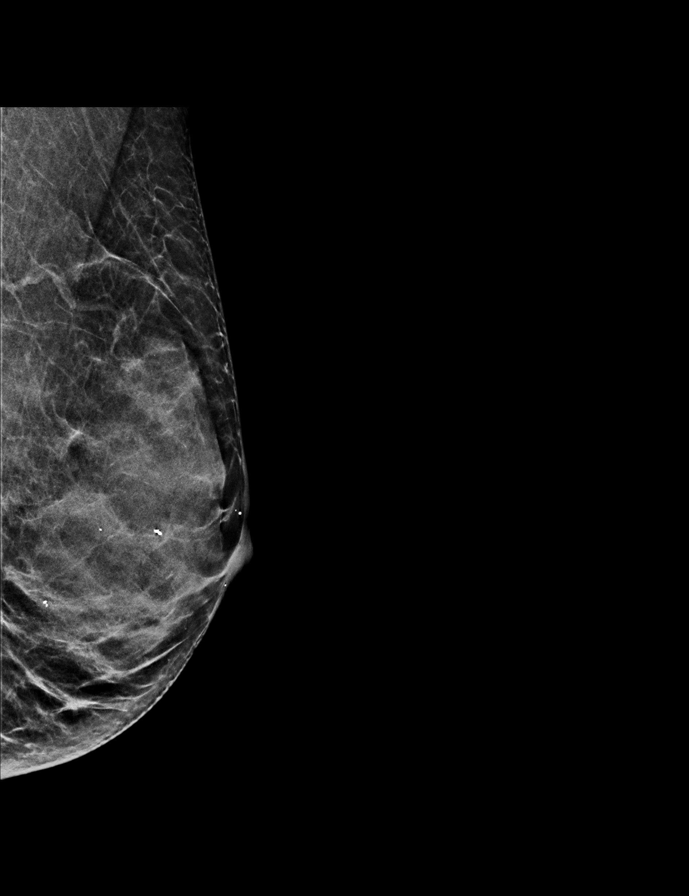

[R MLO synth-2D]
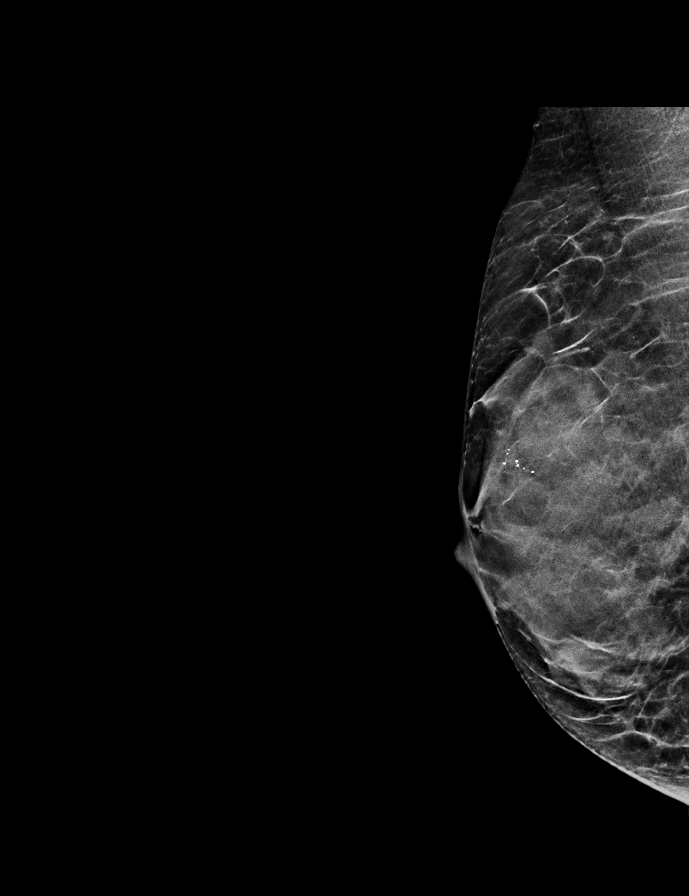

[R MLO tomo · tomo slice 21/41.0]
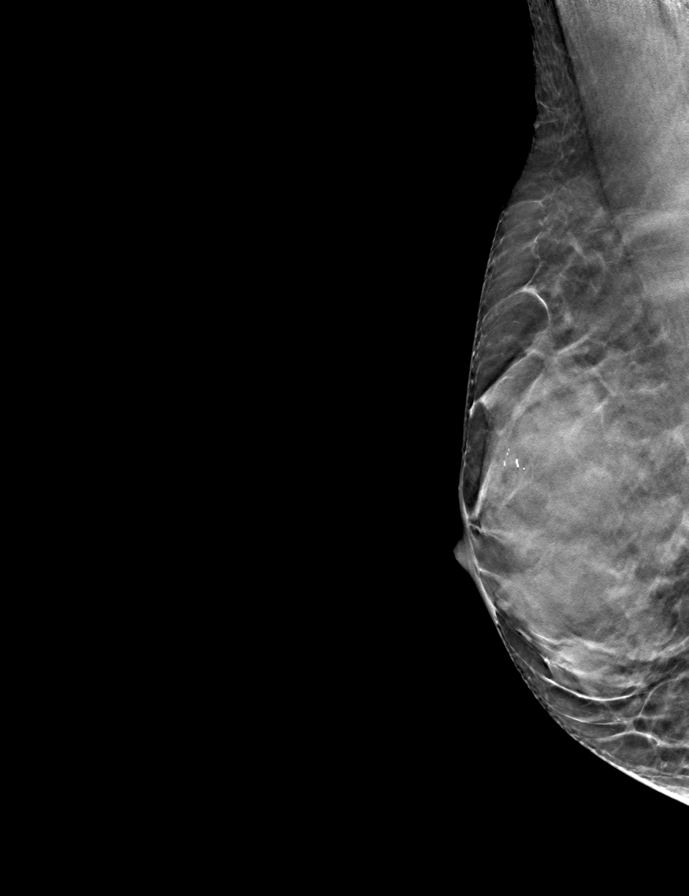

[9 of 28 positions shown; findings below may reference images not displayed]

ACR Breast Density Category d: The breast tissue is extremely dense,
which lowers the sensitivity of mammography.
FINDINGS: There are no findings suspicious for malignancy. Images were
processed with CAD.
IMPRESSION: No mammographic evidence of malignancy. A result letter of this
screening mammogram will be mailed directly to the patient.

RECOMMENDATION:
Screening mammogram in one year. (Code:US-D-RZ7)

BI-RADS CATEGORY  1: Negative.

## 2018-05-29 ENCOUNTER — Encounter: Payer: BLUE CROSS/BLUE SHIELD | Admitting: Internal Medicine

## 2018-06-13 DIAGNOSIS — C44729 Squamous cell carcinoma of skin of left lower limb, including hip: Secondary | ICD-10-CM | POA: Diagnosis not present

## 2018-06-13 DIAGNOSIS — D225 Melanocytic nevi of trunk: Secondary | ICD-10-CM | POA: Diagnosis not present

## 2018-06-13 DIAGNOSIS — L820 Inflamed seborrheic keratosis: Secondary | ICD-10-CM | POA: Diagnosis not present

## 2018-06-13 DIAGNOSIS — Z85828 Personal history of other malignant neoplasm of skin: Secondary | ICD-10-CM | POA: Diagnosis not present

## 2018-06-13 DIAGNOSIS — L720 Epidermal cyst: Secondary | ICD-10-CM | POA: Diagnosis not present

## 2018-06-18 ENCOUNTER — Ambulatory Visit: Payer: BLUE CROSS/BLUE SHIELD | Admitting: *Deleted

## 2018-06-18 ENCOUNTER — Other Ambulatory Visit: Payer: Self-pay

## 2018-06-18 VITALS — Ht 64.0 in | Wt 105.0 lb

## 2018-06-18 DIAGNOSIS — Z86010 Personal history of colonic polyps: Secondary | ICD-10-CM

## 2018-06-18 MED ORDER — NA SULFATE-K SULFATE-MG SULF 17.5-3.13-1.6 GM/177ML PO SOLN: 1.0000 | Freq: Once | ORAL | 0 refills | Status: AC

## 2018-06-18 NOTE — Progress Notes (Signed)
No egg or soy allergy known to patient  No issues with past sedation with any surgeries  or procedures, no intubation problems  No diet pills per patient No home 02 use per patient  No blood thinners per patient  Pt denies issues with constipation  No A fib or A flutter  EMMI video sent to pt's e mail   Pt mailed instruction packet to included paper to complete and mail back to [facility] with addressed and stamped envelope, Emmi video, copy of consent form to read and not return, and instructions. PV completed over the phone. Pt encouraged to call with questions or issues - Suprep $15 coupon to pt.   Mrs Pedretti will be out of town until 07-01-2018- I called her [facility] and asked them to put her Suprep on hold until she returns- verbalized they would do that

## 2018-07-02 ENCOUNTER — Encounter: Payer: Self-pay | Admitting: Internal Medicine

## 2018-07-15 ENCOUNTER — Encounter: Payer: BLUE CROSS/BLUE SHIELD | Admitting: Internal Medicine

## 2018-07-30 ENCOUNTER — Telehealth: Payer: Self-pay | Admitting: Internal Medicine

## 2018-07-30 NOTE — Telephone Encounter (Signed)
Spoke w/patient and all questions answered regarding Covid-19 Screening Questions:   Do you now or have you had a fever in the last 14 days? NO Do you have any respiratory symptoms of shortness of breath or cough now or in the last 14 days? NO Do you have any family members or close contacts with diagnosed or suspected Covid-19 in the past 14 days? NO Have you been tested for Covid-19 and found to be positive? NO

## 2018-07-31 ENCOUNTER — Encounter: Payer: Self-pay | Admitting: Internal Medicine

## 2018-07-31 ENCOUNTER — Ambulatory Visit (AMBULATORY_SURGERY_CENTER): Payer: BC Managed Care – PPO | Admitting: Internal Medicine

## 2018-07-31 ENCOUNTER — Other Ambulatory Visit: Payer: Self-pay

## 2018-07-31 VITALS — BP 97/58 | HR 52 | Temp 98.4°F | Resp 10 | Ht 64.0 in | Wt 105.0 lb

## 2018-07-31 DIAGNOSIS — Z86010 Personal history of colonic polyps: Secondary | ICD-10-CM | POA: Diagnosis not present

## 2018-07-31 DIAGNOSIS — Z1211 Encounter for screening for malignant neoplasm of colon: Secondary | ICD-10-CM | POA: Diagnosis not present

## 2018-07-31 MED ORDER — SODIUM CHLORIDE 0.9 % IV SOLN: 500.0000 mL | Freq: Once | INTRAVENOUS | Status: DC

## 2018-07-31 NOTE — Patient Instructions (Signed)
Repeat colonoscopy in 5 years.   YOU HAD AN ENDOSCOPIC PROCEDURE TODAY AT [facility]:   Refer to the procedure report that was given to you for any specific questions about what was found during the examination.  If the procedure report does not answer your questions, please call your gastroenterologist to clarify.  If you requested that your care partner not be given the details of your procedure findings, then the procedure report has been included in a sealed envelope for you to review at your convenience later.  YOU SHOULD EXPECT: Some feelings of bloating in the abdomen. Passage of more gas than usual.  Walking can help get rid of the air that was put into your GI tract during the procedure and reduce the bloating. If you had a lower endoscopy (such as a colonoscopy or flexible sigmoidoscopy) you may notice spotting of blood in your stool or on the toilet paper. If you underwent a bowel prep for your procedure, you may not have a normal bowel movement for a few days.  Please Note:  You might notice some irritation and congestion in your nose or some drainage.  This is from the oxygen used during your procedure.  There is no need for concern and it should clear up in a day or so.  SYMPTOMS TO REPORT IMMEDIATELY:   Following lower endoscopy (colonoscopy or flexible sigmoidoscopy):  Excessive amounts of blood in the stool  Significant tenderness or worsening of abdominal pains  Swelling of the abdomen that is new, acute  Fever of 100F or higher  For urgent or emergent issues, a gastroenterologist can be reached at any hour by calling [phone].   DIET:  We do recommend a small meal at first, but then you may proceed to your regular diet.  Drink plenty of fluids but you should avoid alcoholic beverages for 24 hours.  ACTIVITY:  You should plan to take it easy for the rest of today and you should NOT DRIVE or use heavy machinery until tomorrow (because of the sedation  medicines used during the test).    FOLLOW UP: Our staff will call the number listed on your records 48-72 hours following your procedure to check on you and address any questions or concerns that you may have regarding the information given to you following your procedure. If we do not reach you, we will leave a message.  We will attempt to reach you two times.  During this call, we will ask if you have developed any symptoms of COVID 19. If you develop any symptoms (ie: fever, flu-like symptoms, shortness of breath, cough etc.) before then, please call [phone].  If you test positive for Covid 19 in the 2 weeks post procedure, please call and report this information to [place].    If any biopsies were taken you will be contacted by phone or by letter within the next 1-3 weeks.  Please call us at [phone] if you have not heard about the biopsies in 3 weeks.    SIGNATURES/CONFIDENTIALITY: You and/or your care partner have signed paperwork which will be entered into your electronic medical record.  These signatures attest to the fact that that the information above on your After Visit Summary has been reviewed and is understood.  Full responsibility of the confidentiality of this discharge information lies with you and/or your care-partner.

## 2018-07-31 NOTE — Op Note (Signed)
[place] Patient Name: Aurelia Gras Procedure Date: 07/31/2018 11:11 AM MRN: [id number] Endoscopist: Docia Chuck. Henrene Pastor , MD Age: 68 Referring MD:  Date of Birth: 1950/08/02 Gender: Female Account #: [id number] Procedure:                Colonoscopy Indications:              High risk colon cancer surveillance: Personal                            history of non-advanced adenoma, High risk colon                            cancer surveillance: Personal history of sessile                            serrated colon polyp (less than 10 mm in size) with                            no dysplasia. Previous examination November 2016 Medicines:                Monitored Anesthesia Care Procedure:                Pre-Anesthesia Assessment:                           - Prior to the procedure, a History and Physical                            was performed, and patient medications and                            allergies were reviewed. The patient's tolerance of                            previous anesthesia was also reviewed. The risks                            and benefits of the procedure and the sedation                            options and risks were discussed with the patient.                            All questions were answered, and informed consent                            was obtained. Prior Anticoagulants: The patient has                            taken no previous anticoagulant or antiplatelet                            agents. ASA Grade Assessment: II - A patient with  mild systemic disease. After reviewing the risks                            and benefits, the patient was deemed in                            satisfactory condition to undergo the procedure.                           After obtaining informed consent, the colonoscope                            was passed under direct vision. Throughout the                            procedure, the  patient's blood pressure, pulse, and                            oxygen saturations were monitored continuously. The                            Colonoscope was introduced through the anus and                            advanced to the the cecum, identified by                            appendiceal orifice and ileocecal valve. The                            ileocecal valve, appendiceal orifice, and rectum                            were photographed. The quality of the bowel                            preparation was excellent. The colonoscopy was                            performed without difficulty. The patient tolerated                            the procedure well. The bowel preparation used was                            SUPREP via split dose instruction. Scope In: 11:24:07 AM Scope Out: 11:39:48 AM Scope Withdrawal Time: 0 hours 12 minutes 16 seconds  Total Procedure Duration: 0 hours 15 minutes 41 seconds  Findings:                 The entire examined colon appeared normal on direct                            and retroflexion views. Complications:  No immediate complications. Estimated blood loss:                            None. Estimated Blood Loss:     Estimated blood loss: none. Impression:               - The entire examined colon is normal on direct and                            retroflexion views.                           - No specimens collected. Recommendation:           - Repeat colonoscopy in 5 years for surveillance                            (personal history of multiple polyps).                           - Patient has a contact number available for                            emergencies. The signs and symptoms of potential                            delayed complications were discussed with the                            patient. Return to normal activities tomorrow.                            Written discharge instructions were provided to the                             patient.                           - Resume previous diet.                           - Continue present medications. Docia Chuck. Henrene Pastor, MD 07/31/2018 11:50:35 AM This report has been signed electronically.

## 2018-07-31 NOTE — Progress Notes (Signed)
Pt's states no medical or surgical changes since previsit or office visit.  Temp taken by CW VS taken by JB 

## 2018-07-31 NOTE — Progress Notes (Signed)
Report to PACU, RN, vss, BBS= Clear.  

## 2018-08-02 ENCOUNTER — Telehealth: Payer: Self-pay

## 2018-08-02 NOTE — Telephone Encounter (Signed)
  Follow up Call-  Call back number 07/31/2018  Post procedure Call Back phone  # [phone]  Permission to leave phone message Yes  Some recent data might be hidden     Patient questions:  Do you have a fever, pain , or abdominal swelling? No. Pain Score  0 *  Have you tolerated food without any problems? Yes.    Have you been able to return to your normal activities? Yes.    Do you have any questions about your discharge instructions: Diet   No. Medications  No. Follow up visit  No.  Do you have questions or concerns about your Care? No.  Actions: * If pain score is 4 or above: No action needed, pain <4.  1. Have you developed a fever since your procedure? no  2.   Have you had an respiratory symptoms (SOB or cough) since your procedure? no  3.   Have you tested positive for COVID 19 since your procedure no  4.   Have you had any family members/close contacts diagnosed with the COVID 19 since your procedure?  no   If yes to any of these questions please route to Joylene John, RN and Alphonsa Gin, Therapist, sports.

## 2018-09-19 DIAGNOSIS — Z85828 Personal history of other malignant neoplasm of skin: Secondary | ICD-10-CM | POA: Diagnosis not present

## 2018-09-19 DIAGNOSIS — B078 Other viral warts: Secondary | ICD-10-CM | POA: Diagnosis not present

## 2018-09-19 DIAGNOSIS — L821 Other seborrheic keratosis: Secondary | ICD-10-CM | POA: Diagnosis not present

## 2018-09-19 DIAGNOSIS — D485 Neoplasm of uncertain behavior of skin: Secondary | ICD-10-CM | POA: Diagnosis not present

## 2018-11-12 NOTE — Progress Notes (Signed)
68 y.o. [id number] Married Caucasian female here for annual exam.    Denies vaginal bleeding or spotting.  No bladder or bowel problems.  Had a terrible UTI this summer.  No recurrent UTI.  Has only one kidney.  PCP:   Marton Redwood, MD  Patient's last menstrual period was 02/06/2002 (approximate).           Sexually active: Yes.    The current method of family planning is post menopausal status.    Exercising: Yes.    Gym/ health club routine includes cardio.and weights. Smoker: Social smoker  Health Maintenance: Pap:11-07-17 Neg:Neg HR HPV, 04-30-15 Neg:Neg HR HPV, 03-20-12 Neg:Neg HR HPV History of abnormal Pap:  no MMG: 04-08-18 3D/Neg/density D/Birads1 Colonoscopy:07-31-18 normal;next 5 years BMD:  2017 per patient  Result :Osteoporosis with PCP TDaP:  PCP Gardasil:   n/a [id number] Hep C:Unsure Screening Labs:  PCP.  Flu vaccine:  Completed.  Shingrix:  First completed.    reports that she has been smoking. She has never used smokeless tobacco. She reports current alcohol use of about 14.0 standard drinks of alcohol per week. She reports that she does not use drugs.  Past Medical History:  Diagnosis Date  . Allergy   . Anxiety    past hx- not current5-01-2019  . Blood transfusion without reported diagnosis    w/nephrectomy at age 39  . Dyspareunia   . Frequent UTI    Hematuria  . History of nephrectomy     right nephrectomy, fall from horse age 52.  Marland Kitchen Hypertension   . Osteopenia     Past Surgical History:  Procedure Laterality Date  . APPENDECTOMY    . COLONOSCOPY  01-21-2004   with brodie Normal  . NEPHRECTOMY     --age 34  . POLYPECTOMY    . TUBAL LIGATION    . WRIST FRACTURE SURGERY Left 02/2018    Current Outpatient Medications  Medication Sig Dispense Refill  . alendronate (FOSAMAX) 70 MG tablet alendronate 70 mg tablet  TAKE 1 TAB ONCE WEEKLY. TAKE ON AN EMPTY STOMACH WITH A FULL GLASS OF WATER. STAY UPRIGHT    . Calcium Carb-Cholecalciferol  (CALCIUM 600 + D PO) Take by mouth daily.    . Cholecalciferol (VITAMIN D PO) Take 1,000 Units by mouth daily. Vitamin D    . fluorouracil (EFUDEX) 5 % cream Apply topically 2 (two) times daily.    . Ibuprofen (ADVIL) 200 MG CAPS Take 2 capsules by mouth as needed.    . mometasone (NASONEX) 50 MCG/ACT nasal spray Place 1 spray into the nose as needed.    Marland Kitchen olmesartan (BENICAR) 20 MG tablet olmesartan 20 mg tablet  TAKE 1 TABLET BY MOUTH EVERY DAY    . tretinoin (RETIN-A) 0.1 % cream as needed.  2  . Zolpidem Tartrate (AMBIEN PO) Take 5 mg by mouth as needed. Ambien     No current facility-administered medications for this visit.     Family History  Problem Relation Age of Onset  . Hypertension Mother   . Osteoporosis Mother   . Anxiety disorder Sister   . Hyperlipidemia Father   . Diabetes Maternal Grandfather   . Stroke Maternal Grandfather   . Colon cancer Neg Hx   . Colon polyps Neg Hx   . Rectal cancer Neg Hx   . Stomach cancer Neg Hx   . Esophageal cancer Neg Hx     Review of Systems  All other systems reviewed and are negative.   Exam:  BP 108/70   Pulse (!) 56   Temp 97.6 F (36.4 C) (Temporal)   Resp 12   Ht 5' 2.5" (1.588 m)   Wt 106 lb 9.6 oz (48.4 kg)   LMP 02/06/2002 (Approximate)   BMI 19.19 kg/m     General appearance: alert, cooperative and appears stated age Head: normocephalic, without obvious abnormality, atraumatic Neck: no adenopathy, supple, symmetrical, trachea midline and thyroid normal to inspection and palpation Lungs: clear to auscultation bilaterally Breasts: normal appearance, no masses or tenderness, No nipple retraction or dimpling, No nipple discharge or bleeding, No axillary adenopathy Heart: regular rate and rhythm Abdomen: soft, non-tender; no masses, no organomegaly Extremities: extremities normal, atraumatic, no cyanosis or edema Skin: skin color, texture, turgor normal. No rashes or lesions Lymph nodes: cervical,  supraclavicular, and axillary nodes normal. Neurologic: grossly normal  Pelvic: External genitalia:  no lesions              No abnormal inguinal nodes palpated.              Urethra:  normal appearing urethra with no masses, tenderness or lesions              Bartholins and Skenes: normal                 Vagina: normal appearing vagina with normal color and discharge, no lesions              Cervix: no lesions              Pap taken: No. Bimanual Exam:  Uterus:  normal size, contour, position, consistency, mobility, non-tender              Adnexa: no mass, fullness, tenderness              Rectal exam: Yes.  .  Confirms.              Anus:  normal sphincter tone, no lesions  Chaperone was present for exam.  Assessment:   Well woman visit with normal exam. Osteoporosis.  On Fosamax.  Social smoker.  Hx right nephrectomy.   Plan: Mammogram screening discussed. Self breast awareness reviewed. Pap and HR HPV as above. Guidelines for Calcium, Vitamin D, regular exercise program including cardiovascular and weight bearing exercise. Will get a copy of her last BMD from her PCP office.  Labs with PCP.  Follow up annually and prn.   After visit summary provided.

## 2018-11-13 ENCOUNTER — Encounter: Payer: Self-pay | Admitting: Obstetrics and Gynecology

## 2018-11-13 ENCOUNTER — Ambulatory Visit (INDEPENDENT_AMBULATORY_CARE_PROVIDER_SITE_OTHER): Payer: BC Managed Care – PPO | Admitting: Obstetrics and Gynecology

## 2018-11-13 ENCOUNTER — Other Ambulatory Visit: Payer: Self-pay

## 2018-11-13 VITALS — BP 108/70 | HR 56 | Temp 97.6°F | Resp 12 | Ht 62.5 in | Wt 106.6 lb

## 2018-11-13 DIAGNOSIS — Z01419 Encounter for gynecological examination (general) (routine) without abnormal findings: Secondary | ICD-10-CM

## 2018-11-13 NOTE — Patient Instructions (Signed)

## 2018-12-13 DIAGNOSIS — H11003 Unspecified pterygium of eye, bilateral: Secondary | ICD-10-CM | POA: Diagnosis not present

## 2018-12-13 DIAGNOSIS — H5202 Hypermetropia, left eye: Secondary | ICD-10-CM | POA: Diagnosis not present

## 2019-01-27 DIAGNOSIS — Z03818 Encounter for observation for suspected exposure to other biological agents ruled out: Secondary | ICD-10-CM | POA: Diagnosis not present

## 2019-02-19 DIAGNOSIS — R42 Dizziness and giddiness: Secondary | ICD-10-CM | POA: Diagnosis not present

## 2019-02-19 DIAGNOSIS — Z79899 Other long term (current) drug therapy: Secondary | ICD-10-CM | POA: Diagnosis not present

## 2019-02-19 DIAGNOSIS — J3489 Other specified disorders of nose and nasal sinuses: Secondary | ICD-10-CM | POA: Diagnosis not present

## 2019-02-19 DIAGNOSIS — J309 Allergic rhinitis, unspecified: Secondary | ICD-10-CM | POA: Diagnosis not present

## 2019-02-19 DIAGNOSIS — I10 Essential (primary) hypertension: Secondary | ICD-10-CM | POA: Diagnosis not present

## 2019-02-19 DIAGNOSIS — Z0000 Encounter for general adult medical examination without abnormal findings: Secondary | ICD-10-CM | POA: Diagnosis not present

## 2019-02-19 DIAGNOSIS — M859 Disorder of bone density and structure, unspecified: Secondary | ICD-10-CM | POA: Diagnosis not present

## 2019-02-20 DIAGNOSIS — R82998 Other abnormal findings in urine: Secondary | ICD-10-CM | POA: Diagnosis not present

## 2019-02-27 DIAGNOSIS — M8580 Other specified disorders of bone density and structure, unspecified site: Secondary | ICD-10-CM | POA: Diagnosis not present

## 2019-02-27 DIAGNOSIS — J3489 Other specified disorders of nose and nasal sinuses: Secondary | ICD-10-CM | POA: Diagnosis not present

## 2019-02-27 DIAGNOSIS — R42 Dizziness and giddiness: Secondary | ICD-10-CM | POA: Diagnosis not present

## 2019-02-27 DIAGNOSIS — Z0000 Encounter for general adult medical examination without abnormal findings: Secondary | ICD-10-CM | POA: Diagnosis not present

## 2019-02-27 DIAGNOSIS — I10 Essential (primary) hypertension: Secondary | ICD-10-CM | POA: Diagnosis not present

## 2019-03-04 ENCOUNTER — Ambulatory Visit: Payer: BC Managed Care – PPO

## 2019-03-13 ENCOUNTER — Ambulatory Visit: Payer: BC Managed Care – PPO | Attending: Internal Medicine

## 2019-03-13 DIAGNOSIS — Z23 Encounter for immunization: Secondary | ICD-10-CM | POA: Insufficient documentation

## 2019-03-13 NOTE — Progress Notes (Signed)
   Covid-19 Vaccination Clinic  Name:  Natasha Stevens    MRN: [id number] DOB: [birth year]  03/13/2019  [name] was observed post Covid-19 immunization for 15 minutes without incidence. She was provided with Vaccine Information Sheet and instruction to access the V-Safe system.   [name] was instructed to call 911 with any severe reactions post vaccine: Marland Kitchen Difficulty breathing  . Swelling of your face and throat  . A fast heartbeat  . A bad rash all over your body  . Dizziness and weakness    Immunizations Administered    Name Date Dose VIS Date Route   Pfizer COVID-19 Vaccine 03/13/2019  5:08 PM 0.3 mL 01/17/2019 Intramuscular   Manufacturer: [place]   Lot: [id number]   [place]: [id number]

## 2019-03-30 ENCOUNTER — Ambulatory Visit: Payer: BC Managed Care – PPO

## 2019-04-08 ENCOUNTER — Ambulatory Visit: Payer: Self-pay | Attending: Internal Medicine

## 2019-04-08 DIAGNOSIS — Z23 Encounter for immunization: Secondary | ICD-10-CM | POA: Insufficient documentation

## 2019-04-08 NOTE — Progress Notes (Signed)
   Covid-19 Vaccination Clinic  Name:  Natasha Stevens    MRN: [id number] DOB: 1950-12-22  04/08/2019  [name] was observed post Covid-19 immunization for 15 minutes without incident. She was provided with Vaccine Information Sheet and instruction to access the V-Safe system.   [name] was instructed to call 911 with any severe reactions post vaccine: Marland Kitchen Difficulty breathing  . Swelling of face and throat  . A fast heartbeat  . A bad rash all over body  . Dizziness and weakness   Immunizations Administered    Name Date Dose VIS Date Route   Pfizer COVID-19 Vaccine 04/08/2019  8:19 AM 0.3 mL 01/17/2019 Intramuscular   Manufacturer: [place]   Lot: [id number]   [place]: [id number]

## 2019-06-05 DIAGNOSIS — Z03818 Encounter for observation for suspected exposure to other biological agents ruled out: Secondary | ICD-10-CM | POA: Diagnosis not present

## 2019-06-05 DIAGNOSIS — Z20828 Contact with and (suspected) exposure to other viral communicable diseases: Secondary | ICD-10-CM | POA: Diagnosis not present

## 2019-06-09 ENCOUNTER — Other Ambulatory Visit: Payer: Self-pay | Admitting: Internal Medicine

## 2019-06-09 DIAGNOSIS — Z1231 Encounter for screening mammogram for malignant neoplasm of breast: Secondary | ICD-10-CM

## 2019-06-11 DIAGNOSIS — Z85828 Personal history of other malignant neoplasm of skin: Secondary | ICD-10-CM | POA: Diagnosis not present

## 2019-06-11 DIAGNOSIS — D225 Melanocytic nevi of trunk: Secondary | ICD-10-CM | POA: Diagnosis not present

## 2019-06-11 DIAGNOSIS — L820 Inflamed seborrheic keratosis: Secondary | ICD-10-CM | POA: Diagnosis not present

## 2019-06-11 DIAGNOSIS — D2261 Melanocytic nevi of right upper limb, including shoulder: Secondary | ICD-10-CM | POA: Diagnosis not present

## 2019-06-11 DIAGNOSIS — L570 Actinic keratosis: Secondary | ICD-10-CM | POA: Diagnosis not present

## 2019-06-11 DIAGNOSIS — L821 Other seborrheic keratosis: Secondary | ICD-10-CM | POA: Diagnosis not present

## 2019-06-13 ENCOUNTER — Other Ambulatory Visit: Payer: Self-pay

## 2019-06-13 ENCOUNTER — Ambulatory Visit
Admission: RE | Admit: 2019-06-13 | Discharge: 2019-06-13 | Disposition: A | Discharge status: Home / Self Care | Payer: BC Managed Care – PPO | Source: Ambulatory Visit | Attending: Internal Medicine | Admitting: Internal Medicine

## 2019-06-13 DIAGNOSIS — Z1231 Encounter for screening mammogram for malignant neoplasm of breast: Secondary | ICD-10-CM | POA: Diagnosis not present

## 2019-10-20 DIAGNOSIS — L738 Other specified follicular disorders: Secondary | ICD-10-CM | POA: Diagnosis not present

## 2019-10-20 DIAGNOSIS — L720 Epidermal cyst: Secondary | ICD-10-CM | POA: Diagnosis not present

## 2019-12-12 DIAGNOSIS — R5383 Other fatigue: Secondary | ICD-10-CM | POA: Diagnosis not present

## 2019-12-12 DIAGNOSIS — B349 Viral infection, unspecified: Secondary | ICD-10-CM | POA: Diagnosis not present

## 2019-12-12 DIAGNOSIS — Z1152 Encounter for screening for COVID-19: Secondary | ICD-10-CM | POA: Diagnosis not present

## 2019-12-12 DIAGNOSIS — R3915 Urgency of urination: Secondary | ICD-10-CM | POA: Diagnosis not present

## 2020-02-04 DIAGNOSIS — D23111 Other benign neoplasm of skin of right upper eyelid, including canthus: Secondary | ICD-10-CM | POA: Diagnosis not present

## 2020-02-24 DIAGNOSIS — Z79899 Other long term (current) drug therapy: Secondary | ICD-10-CM | POA: Diagnosis not present

## 2020-02-24 DIAGNOSIS — M859 Disorder of bone density and structure, unspecified: Secondary | ICD-10-CM | POA: Diagnosis not present

## 2020-02-24 DIAGNOSIS — Z0000 Encounter for general adult medical examination without abnormal findings: Secondary | ICD-10-CM | POA: Diagnosis not present

## 2020-02-27 DIAGNOSIS — R509 Fever, unspecified: Secondary | ICD-10-CM | POA: Diagnosis not present

## 2020-03-10 DIAGNOSIS — I10 Essential (primary) hypertension: Secondary | ICD-10-CM | POA: Diagnosis not present

## 2020-03-10 DIAGNOSIS — Z1331 Encounter for screening for depression: Secondary | ICD-10-CM | POA: Diagnosis not present

## 2020-03-10 DIAGNOSIS — Z23 Encounter for immunization: Secondary | ICD-10-CM | POA: Diagnosis not present

## 2020-03-10 DIAGNOSIS — Z1339 Encounter for screening examination for other mental health and behavioral disorders: Secondary | ICD-10-CM | POA: Diagnosis not present

## 2020-03-10 DIAGNOSIS — Z0000 Encounter for general adult medical examination without abnormal findings: Secondary | ICD-10-CM | POA: Diagnosis not present

## 2020-03-16 DIAGNOSIS — R6883 Chills (without fever): Secondary | ICD-10-CM | POA: Diagnosis not present

## 2020-03-16 DIAGNOSIS — I10 Essential (primary) hypertension: Secondary | ICD-10-CM | POA: Diagnosis not present

## 2020-03-16 DIAGNOSIS — R3915 Urgency of urination: Secondary | ICD-10-CM | POA: Diagnosis not present

## 2020-03-29 DIAGNOSIS — M8589 Other specified disorders of bone density and structure, multiple sites: Secondary | ICD-10-CM | POA: Diagnosis not present

## 2020-04-13 DIAGNOSIS — L814 Other melanin hyperpigmentation: Secondary | ICD-10-CM | POA: Diagnosis not present

## 2020-04-13 DIAGNOSIS — D2261 Melanocytic nevi of right upper limb, including shoulder: Secondary | ICD-10-CM | POA: Diagnosis not present

## 2020-04-13 DIAGNOSIS — L570 Actinic keratosis: Secondary | ICD-10-CM | POA: Diagnosis not present

## 2020-04-13 DIAGNOSIS — Z85828 Personal history of other malignant neoplasm of skin: Secondary | ICD-10-CM | POA: Diagnosis not present

## 2020-04-13 DIAGNOSIS — L821 Other seborrheic keratosis: Secondary | ICD-10-CM | POA: Diagnosis not present

## 2020-04-13 DIAGNOSIS — D485 Neoplasm of uncertain behavior of skin: Secondary | ICD-10-CM | POA: Diagnosis not present

## 2020-05-24 ENCOUNTER — Other Ambulatory Visit: Payer: Self-pay | Admitting: Internal Medicine

## 2020-05-24 DIAGNOSIS — M5412 Radiculopathy, cervical region: Secondary | ICD-10-CM | POA: Diagnosis not present

## 2020-05-24 DIAGNOSIS — Z1231 Encounter for screening mammogram for malignant neoplasm of breast: Secondary | ICD-10-CM

## 2020-06-02 DIAGNOSIS — M542 Cervicalgia: Secondary | ICD-10-CM | POA: Diagnosis not present

## 2020-06-02 DIAGNOSIS — M5412 Radiculopathy, cervical region: Secondary | ICD-10-CM | POA: Diagnosis not present

## 2020-06-09 DIAGNOSIS — M5412 Radiculopathy, cervical region: Secondary | ICD-10-CM | POA: Diagnosis not present

## 2020-06-09 DIAGNOSIS — M542 Cervicalgia: Secondary | ICD-10-CM | POA: Diagnosis not present

## 2020-06-25 DIAGNOSIS — M5412 Radiculopathy, cervical region: Secondary | ICD-10-CM | POA: Diagnosis not present

## 2020-06-25 DIAGNOSIS — M542 Cervicalgia: Secondary | ICD-10-CM | POA: Diagnosis not present

## 2020-07-12 ENCOUNTER — Other Ambulatory Visit: Payer: Self-pay

## 2020-07-12 ENCOUNTER — Ambulatory Visit
Admission: RE | Admit: 2020-07-12 | Discharge: 2020-07-12 | Disposition: A | Discharge status: Home / Self Care | Payer: BC Managed Care – PPO | Source: Ambulatory Visit | Attending: Internal Medicine | Admitting: Internal Medicine

## 2020-07-12 DIAGNOSIS — Z1231 Encounter for screening mammogram for malignant neoplasm of breast: Secondary | ICD-10-CM

## 2020-08-22 DIAGNOSIS — R051 Acute cough: Secondary | ICD-10-CM | POA: Diagnosis not present

## 2020-08-22 DIAGNOSIS — I10 Essential (primary) hypertension: Secondary | ICD-10-CM | POA: Diagnosis not present

## 2020-08-22 DIAGNOSIS — U071 COVID-19: Secondary | ICD-10-CM | POA: Diagnosis not present

## 2020-08-22 DIAGNOSIS — J029 Acute pharyngitis, unspecified: Secondary | ICD-10-CM | POA: Diagnosis not present

## 2020-09-14 DIAGNOSIS — H9202 Otalgia, left ear: Secondary | ICD-10-CM | POA: Diagnosis not present

## 2020-11-22 DIAGNOSIS — J209 Acute bronchitis, unspecified: Secondary | ICD-10-CM | POA: Diagnosis not present

## 2020-11-22 DIAGNOSIS — H9202 Otalgia, left ear: Secondary | ICD-10-CM | POA: Diagnosis not present

## 2020-11-22 DIAGNOSIS — R059 Cough, unspecified: Secondary | ICD-10-CM | POA: Diagnosis not present

## 2020-12-02 DIAGNOSIS — H9202 Otalgia, left ear: Secondary | ICD-10-CM | POA: Diagnosis not present

## 2020-12-02 DIAGNOSIS — M26609 Unspecified temporomandibular joint disorder, unspecified side: Secondary | ICD-10-CM | POA: Diagnosis not present

## 2021-02-07 DIAGNOSIS — H524 Presbyopia: Secondary | ICD-10-CM | POA: Diagnosis not present

## 2021-02-07 DIAGNOSIS — H5202 Hypermetropia, left eye: Secondary | ICD-10-CM | POA: Diagnosis not present

## 2021-02-07 DIAGNOSIS — H11003 Unspecified pterygium of eye, bilateral: Secondary | ICD-10-CM | POA: Diagnosis not present

## 2021-02-07 DIAGNOSIS — D23111 Other benign neoplasm of skin of right upper eyelid, including canthus: Secondary | ICD-10-CM | POA: Diagnosis not present

## 2021-03-31 DIAGNOSIS — I10 Essential (primary) hypertension: Secondary | ICD-10-CM | POA: Diagnosis not present

## 2021-03-31 DIAGNOSIS — M859 Disorder of bone density and structure, unspecified: Secondary | ICD-10-CM | POA: Diagnosis not present

## 2021-04-07 ENCOUNTER — Other Ambulatory Visit: Payer: Self-pay | Admitting: Internal Medicine

## 2021-04-07 DIAGNOSIS — I10 Essential (primary) hypertension: Secondary | ICD-10-CM

## 2021-04-07 DIAGNOSIS — Z0000 Encounter for general adult medical examination without abnormal findings: Secondary | ICD-10-CM | POA: Diagnosis not present

## 2021-04-07 DIAGNOSIS — H93A2 Pulsatile tinnitus, left ear: Secondary | ICD-10-CM

## 2021-04-08 ENCOUNTER — Ambulatory Visit
Admission: RE | Admit: 2021-04-08 | Discharge: 2021-04-08 | Disposition: A | Discharge status: Home / Self Care | Payer: BC Managed Care – PPO | Source: Ambulatory Visit | Attending: Internal Medicine | Admitting: Internal Medicine

## 2021-04-08 ENCOUNTER — Other Ambulatory Visit: Payer: BC Managed Care – PPO

## 2021-04-08 DIAGNOSIS — Z1339 Encounter for screening examination for other mental health and behavioral disorders: Secondary | ICD-10-CM | POA: Diagnosis not present

## 2021-04-08 DIAGNOSIS — I6523 Occlusion and stenosis of bilateral carotid arteries: Secondary | ICD-10-CM | POA: Diagnosis not present

## 2021-04-08 DIAGNOSIS — H93A2 Pulsatile tinnitus, left ear: Secondary | ICD-10-CM | POA: Diagnosis not present

## 2021-04-08 DIAGNOSIS — Z1331 Encounter for screening for depression: Secondary | ICD-10-CM | POA: Diagnosis not present

## 2021-04-08 MED ORDER — IOPAMIDOL (ISOVUE-370) INJECTION 76%
65.0000 mL | Freq: Once | INTRAVENOUS | Status: AC | PRN
  Administered 2021-04-08: 65 mL via INTRAVENOUS

## 2021-04-19 DIAGNOSIS — D485 Neoplasm of uncertain behavior of skin: Secondary | ICD-10-CM | POA: Diagnosis not present

## 2021-04-19 DIAGNOSIS — Z85828 Personal history of other malignant neoplasm of skin: Secondary | ICD-10-CM | POA: Diagnosis not present

## 2021-04-19 DIAGNOSIS — D1801 Hemangioma of skin and subcutaneous tissue: Secondary | ICD-10-CM | POA: Diagnosis not present

## 2021-04-19 DIAGNOSIS — L570 Actinic keratosis: Secondary | ICD-10-CM | POA: Diagnosis not present

## 2021-04-19 DIAGNOSIS — B078 Other viral warts: Secondary | ICD-10-CM | POA: Diagnosis not present

## 2021-04-19 DIAGNOSIS — D225 Melanocytic nevi of trunk: Secondary | ICD-10-CM | POA: Diagnosis not present

## 2021-04-19 DIAGNOSIS — D2262 Melanocytic nevi of left upper limb, including shoulder: Secondary | ICD-10-CM | POA: Diagnosis not present

## 2021-05-16 ENCOUNTER — Ambulatory Visit
Admission: RE | Admit: 2021-05-16 | Discharge: 2021-05-16 | Disposition: A | Discharge status: Home / Self Care | Payer: No Typology Code available for payment source | Source: Ambulatory Visit | Attending: Internal Medicine | Admitting: Internal Medicine

## 2021-05-16 DIAGNOSIS — I10 Essential (primary) hypertension: Secondary | ICD-10-CM

## 2021-06-09 ENCOUNTER — Other Ambulatory Visit: Payer: Self-pay | Admitting: Internal Medicine

## 2021-06-09 DIAGNOSIS — Z1231 Encounter for screening mammogram for malignant neoplasm of breast: Secondary | ICD-10-CM

## 2021-07-18 DIAGNOSIS — E860 Dehydration: Secondary | ICD-10-CM | POA: Diagnosis not present

## 2021-07-18 DIAGNOSIS — R1013 Epigastric pain: Secondary | ICD-10-CM | POA: Diagnosis not present

## 2021-07-18 DIAGNOSIS — Z8719 Personal history of other diseases of the digestive system: Secondary | ICD-10-CM | POA: Diagnosis not present

## 2021-07-18 DIAGNOSIS — K859 Acute pancreatitis without necrosis or infection, unspecified: Secondary | ICD-10-CM | POA: Diagnosis not present

## 2021-09-07 ENCOUNTER — Ambulatory Visit
Admission: RE | Admit: 2021-09-07 | Discharge: 2021-09-07 | Disposition: A | Discharge status: Home / Self Care | Payer: BC Managed Care – PPO | Source: Ambulatory Visit | Attending: Internal Medicine | Admitting: Internal Medicine

## 2021-09-07 DIAGNOSIS — Z1231 Encounter for screening mammogram for malignant neoplasm of breast: Secondary | ICD-10-CM

## 2022-01-04 DIAGNOSIS — Z23 Encounter for immunization: Secondary | ICD-10-CM | POA: Diagnosis not present

## 2022-01-07 DIAGNOSIS — M5412 Radiculopathy, cervical region: Secondary | ICD-10-CM | POA: Diagnosis not present

## 2022-01-07 DIAGNOSIS — M542 Cervicalgia: Secondary | ICD-10-CM | POA: Diagnosis not present

## 2022-01-10 DIAGNOSIS — M5412 Radiculopathy, cervical region: Secondary | ICD-10-CM | POA: Diagnosis not present

## 2022-01-10 DIAGNOSIS — M542 Cervicalgia: Secondary | ICD-10-CM | POA: Diagnosis not present

## 2022-01-24 DIAGNOSIS — M5412 Radiculopathy, cervical region: Secondary | ICD-10-CM | POA: Diagnosis not present

## 2022-01-24 DIAGNOSIS — M542 Cervicalgia: Secondary | ICD-10-CM | POA: Diagnosis not present

## 2022-02-21 DIAGNOSIS — M542 Cervicalgia: Secondary | ICD-10-CM | POA: Diagnosis not present

## 2022-02-21 DIAGNOSIS — M5412 Radiculopathy, cervical region: Secondary | ICD-10-CM | POA: Diagnosis not present

## 2022-03-10 DIAGNOSIS — H52203 Unspecified astigmatism, bilateral: Secondary | ICD-10-CM | POA: Diagnosis not present

## 2022-03-10 DIAGNOSIS — H2513 Age-related nuclear cataract, bilateral: Secondary | ICD-10-CM | POA: Diagnosis not present

## 2022-04-13 DIAGNOSIS — M8589 Other specified disorders of bone density and structure, multiple sites: Secondary | ICD-10-CM | POA: Diagnosis not present

## 2022-04-13 DIAGNOSIS — R7989 Other specified abnormal findings of blood chemistry: Secondary | ICD-10-CM | POA: Diagnosis not present

## 2022-04-13 DIAGNOSIS — I10 Essential (primary) hypertension: Secondary | ICD-10-CM | POA: Diagnosis not present

## 2022-04-13 DIAGNOSIS — M8580 Other specified disorders of bone density and structure, unspecified site: Secondary | ICD-10-CM | POA: Diagnosis not present

## 2022-04-20 DIAGNOSIS — Z1331 Encounter for screening for depression: Secondary | ICD-10-CM | POA: Diagnosis not present

## 2022-04-20 DIAGNOSIS — M8580 Other specified disorders of bone density and structure, unspecified site: Secondary | ICD-10-CM | POA: Diagnosis not present

## 2022-04-20 DIAGNOSIS — Z1339 Encounter for screening examination for other mental health and behavioral disorders: Secondary | ICD-10-CM | POA: Diagnosis not present

## 2022-04-20 DIAGNOSIS — Z0000 Encounter for general adult medical examination without abnormal findings: Secondary | ICD-10-CM | POA: Diagnosis not present

## 2022-04-20 DIAGNOSIS — I10 Essential (primary) hypertension: Secondary | ICD-10-CM | POA: Diagnosis not present

## 2022-04-20 DIAGNOSIS — I129 Hypertensive chronic kidney disease with stage 1 through stage 4 chronic kidney disease, or unspecified chronic kidney disease: Secondary | ICD-10-CM | POA: Diagnosis not present

## 2022-04-20 DIAGNOSIS — J309 Allergic rhinitis, unspecified: Secondary | ICD-10-CM | POA: Diagnosis not present

## 2022-04-20 DIAGNOSIS — N1832 Chronic kidney disease, stage 3b: Secondary | ICD-10-CM | POA: Diagnosis not present

## 2022-04-25 ENCOUNTER — Other Ambulatory Visit: Payer: Self-pay | Admitting: Internal Medicine

## 2022-04-25 DIAGNOSIS — R911 Solitary pulmonary nodule: Secondary | ICD-10-CM

## 2022-04-26 DIAGNOSIS — Z85828 Personal history of other malignant neoplasm of skin: Secondary | ICD-10-CM | POA: Diagnosis not present

## 2022-04-26 DIAGNOSIS — L814 Other melanin hyperpigmentation: Secondary | ICD-10-CM | POA: Diagnosis not present

## 2022-04-26 DIAGNOSIS — L821 Other seborrheic keratosis: Secondary | ICD-10-CM | POA: Diagnosis not present

## 2022-04-26 DIAGNOSIS — D692 Other nonthrombocytopenic purpura: Secondary | ICD-10-CM | POA: Diagnosis not present

## 2022-05-03 ENCOUNTER — Ambulatory Visit
Admission: RE | Admit: 2022-05-03 | Discharge: 2022-05-03 | Disposition: A | Discharge status: Home / Self Care | Payer: BC Managed Care – PPO | Source: Ambulatory Visit | Attending: Internal Medicine | Admitting: Internal Medicine

## 2022-05-03 DIAGNOSIS — R911 Solitary pulmonary nodule: Secondary | ICD-10-CM

## 2022-09-04 ENCOUNTER — Other Ambulatory Visit: Payer: Self-pay | Admitting: Internal Medicine

## 2022-09-04 DIAGNOSIS — Z1231 Encounter for screening mammogram for malignant neoplasm of breast: Secondary | ICD-10-CM

## 2022-09-26 DIAGNOSIS — L820 Inflamed seborrheic keratosis: Secondary | ICD-10-CM | POA: Diagnosis not present

## 2022-10-20 DIAGNOSIS — N390 Urinary tract infection, site not specified: Secondary | ICD-10-CM | POA: Diagnosis not present

## 2022-10-20 DIAGNOSIS — R5383 Other fatigue: Secondary | ICD-10-CM | POA: Diagnosis not present

## 2022-10-20 DIAGNOSIS — I10 Essential (primary) hypertension: Secondary | ICD-10-CM | POA: Diagnosis not present

## 2022-11-18 DIAGNOSIS — Z23 Encounter for immunization: Secondary | ICD-10-CM | POA: Diagnosis not present

## 2022-11-20 ENCOUNTER — Ambulatory Visit: Payer: No Typology Code available for payment source

## 2022-11-30 ENCOUNTER — Ambulatory Visit
Admission: RE | Admit: 2022-11-30 | Discharge: 2022-11-30 | Disposition: A | Discharge status: Home / Self Care | Payer: BC Managed Care – PPO | Source: Ambulatory Visit | Attending: Internal Medicine | Admitting: Internal Medicine

## 2022-11-30 DIAGNOSIS — Z1231 Encounter for screening mammogram for malignant neoplasm of breast: Secondary | ICD-10-CM

## 2022-12-20 DIAGNOSIS — Z1389 Encounter for screening for other disorder: Secondary | ICD-10-CM | POA: Diagnosis not present

## 2022-12-20 DIAGNOSIS — I10 Essential (primary) hypertension: Secondary | ICD-10-CM | POA: Diagnosis not present

## 2022-12-20 DIAGNOSIS — M2550 Pain in unspecified joint: Secondary | ICD-10-CM | POA: Diagnosis not present

## 2022-12-20 DIAGNOSIS — M059 Rheumatoid arthritis with rheumatoid factor, unspecified: Secondary | ICD-10-CM | POA: Diagnosis not present

## 2023-03-26 DIAGNOSIS — H52203 Unspecified astigmatism, bilateral: Secondary | ICD-10-CM | POA: Diagnosis not present

## 2023-03-26 DIAGNOSIS — H2513 Age-related nuclear cataract, bilateral: Secondary | ICD-10-CM | POA: Diagnosis not present

## 2023-05-09 DIAGNOSIS — D1801 Hemangioma of skin and subcutaneous tissue: Secondary | ICD-10-CM | POA: Diagnosis not present

## 2023-05-09 DIAGNOSIS — L821 Other seborrheic keratosis: Secondary | ICD-10-CM | POA: Diagnosis not present

## 2023-05-09 DIAGNOSIS — Z85828 Personal history of other malignant neoplasm of skin: Secondary | ICD-10-CM | POA: Diagnosis not present

## 2023-05-09 DIAGNOSIS — L570 Actinic keratosis: Secondary | ICD-10-CM | POA: Diagnosis not present

## 2023-05-09 DIAGNOSIS — L309 Dermatitis, unspecified: Secondary | ICD-10-CM | POA: Diagnosis not present

## 2023-05-28 DIAGNOSIS — M25551 Pain in right hip: Secondary | ICD-10-CM | POA: Diagnosis not present

## 2023-08-25 DIAGNOSIS — B029 Zoster without complications: Secondary | ICD-10-CM | POA: Diagnosis not present

## 2023-09-25 DIAGNOSIS — M8580 Other specified disorders of bone density and structure, unspecified site: Secondary | ICD-10-CM | POA: Diagnosis not present

## 2023-09-25 DIAGNOSIS — I10 Essential (primary) hypertension: Secondary | ICD-10-CM | POA: Diagnosis not present

## 2023-09-25 DIAGNOSIS — C44622 Squamous cell carcinoma of skin of right upper limb, including shoulder: Secondary | ICD-10-CM | POA: Diagnosis not present

## 2023-10-02 DIAGNOSIS — Z1339 Encounter for screening examination for other mental health and behavioral disorders: Secondary | ICD-10-CM | POA: Diagnosis not present

## 2023-10-02 DIAGNOSIS — I129 Hypertensive chronic kidney disease with stage 1 through stage 4 chronic kidney disease, or unspecified chronic kidney disease: Secondary | ICD-10-CM | POA: Diagnosis not present

## 2023-10-02 DIAGNOSIS — Z1331 Encounter for screening for depression: Secondary | ICD-10-CM | POA: Diagnosis not present

## 2023-10-02 DIAGNOSIS — R82998 Other abnormal findings in urine: Secondary | ICD-10-CM | POA: Diagnosis not present

## 2023-10-02 DIAGNOSIS — Z0000 Encounter for general adult medical examination without abnormal findings: Secondary | ICD-10-CM | POA: Diagnosis not present

## 2023-12-03 ENCOUNTER — Other Ambulatory Visit: Payer: Self-pay | Admitting: Internal Medicine

## 2023-12-03 DIAGNOSIS — Z1231 Encounter for screening mammogram for malignant neoplasm of breast: Secondary | ICD-10-CM

## 2023-12-06 ENCOUNTER — Ambulatory Visit
Admission: RE | Admit: 2023-12-06 | Discharge: 2023-12-06 | Disposition: A | Discharge status: Home / Self Care | Source: Ambulatory Visit | Attending: Internal Medicine | Admitting: Internal Medicine

## 2023-12-06 DIAGNOSIS — Z1231 Encounter for screening mammogram for malignant neoplasm of breast: Secondary | ICD-10-CM | POA: Diagnosis not present

## 2024-01-05 ENCOUNTER — Encounter: Payer: Self-pay | Admitting: Internal Medicine

## 2024-02-08 ENCOUNTER — Other Ambulatory Visit: Payer: Self-pay | Admitting: Medical Genetics

## 2024-02-14 ENCOUNTER — Encounter

## 2024-02-14 ENCOUNTER — Ambulatory Visit: Admitting: *Deleted

## 2024-02-14 VITALS — Ht 62.5 in | Wt 108.0 lb

## 2024-02-14 DIAGNOSIS — Z860100 Personal history of colon polyps, unspecified: Secondary | ICD-10-CM

## 2024-02-14 MED ORDER — NA SULFATE-K SULFATE-MG SULF 17.5-3.13-1.6 GM/177ML PO SOLN: 1.0000 | Freq: Once | ORAL | 0 refills | Status: AC

## 2024-02-14 NOTE — Progress Notes (Signed)
 Pt's name and DOB verified at the beginning of the pre-visit with 2 identifiers  Pt denies any difficulty with ambulating,sitting, laying down or rolling side to side  Pt has no issues moving head neck or swallowing  No egg or soy allergy known to patient   No issues known to pt with past sedation  No FH of Malignant Hyperthermia  Pt is not on home 02   Pt is not on blood thinners   Pt denies issues with constipation   Pt is not on dialysis  Pt denise any abnormal heart rhythms   Pt denies any upcoming cardiac testing  Patient's chart reviewed by Norleen Schillings CNRA prior to pre-visit and patient appropriate for the [facility].  Pre-visit completed and red dot placed by patient's name on their procedure day (on provider's schedule).    Visit by phone  Pt states weight is 108 lb   Pt given  both [facility] main # and MD on call # prior to instructions.  Informed pt to come in at the time discussed and is shown on PV instructions.  Pt instructed to use Single[employer] or GoodRx for a price reduction on prep  Instructed pt where to find PV instructions in My Chart. . Instructed pt on all aspects of written instructions including med holds clothing to wear and foods to eat and not eat as well as after procedure legal restrictions and to call MD on call if needed.. Pt states understanding. Instructed pt to review instructions again prior to procedure and call main # given if has any questions or any issues. Pt states they will.

## 2024-02-19 ENCOUNTER — Other Ambulatory Visit

## 2024-02-19 DIAGNOSIS — Z006 Encounter for examination for normal comparison and control in clinical research program: Secondary | ICD-10-CM

## 2024-02-21 ENCOUNTER — Encounter: Payer: Self-pay | Admitting: Internal Medicine

## 2024-02-29 ENCOUNTER — Telehealth: Payer: Self-pay

## 2024-02-29 LAB — GENECONNECT MOLECULAR SCREEN: Genetic Analysis Overall Interpretation: NEGATIVE

## 2024-02-29 NOTE — Telephone Encounter (Signed)
 Phoned patient to reschedule her colonoscopy due to the clinic being closed because of bad weather. Patient did not have her calendar available and agreed to call back next week to reschedule. Will follow up with patient next week

## 2024-03-03 ENCOUNTER — Encounter: Admitting: Internal Medicine
